# Patient Record
Sex: Female | Born: 1955 | Race: Black or African American | Hispanic: No | Smoking: Never smoker
Health system: Southern US, Community
[De-identification: ages and names within clinical notes are randomized; demographics above are authoritative.]

## PROBLEM LIST (undated history)

## (undated) DIAGNOSIS — I10 Essential (primary) hypertension: Secondary | ICD-10-CM

## (undated) HISTORY — PX: KNEE SURGERY: SHX244

## (undated) HISTORY — PX: BREAST REDUCTION SURGERY: SHX8

## (undated) HISTORY — PX: ABDOMINAL HYSTERECTOMY: SHX81

---

## 2001-11-10 ENCOUNTER — Encounter: Payer: Self-pay | Admitting: Obstetrics & Gynecology

## 2001-11-10 ENCOUNTER — Ambulatory Visit (HOSPITAL_COMMUNITY): Admission: RE | Admit: 2001-11-10 | Discharge: 2001-11-10 | Payer: Self-pay | Admitting: Obstetrics & Gynecology

## 2001-12-16 ENCOUNTER — Encounter: Payer: Self-pay | Admitting: Obstetrics & Gynecology

## 2001-12-16 ENCOUNTER — Ambulatory Visit (HOSPITAL_COMMUNITY): Admission: RE | Admit: 2001-12-16 | Discharge: 2001-12-16 | Payer: Self-pay | Admitting: Obstetrics & Gynecology

## 2002-02-10 ENCOUNTER — Encounter (INDEPENDENT_AMBULATORY_CARE_PROVIDER_SITE_OTHER): Payer: Self-pay

## 2002-02-11 ENCOUNTER — Inpatient Hospital Stay (HOSPITAL_COMMUNITY): Admission: AD | Admit: 2002-02-11 | Discharge: 2002-02-12 | Payer: Self-pay | Admitting: Obstetrics & Gynecology

## 2002-04-29 ENCOUNTER — Emergency Department (HOSPITAL_COMMUNITY): Admission: EM | Admit: 2002-04-29 | Discharge: 2002-04-29 | Payer: Self-pay | Admitting: Emergency Medicine

## 2002-04-29 ENCOUNTER — Encounter: Payer: Self-pay | Admitting: Emergency Medicine

## 2003-08-04 ENCOUNTER — Ambulatory Visit (HOSPITAL_COMMUNITY): Admission: RE | Admit: 2003-08-04 | Discharge: 2003-08-04 | Payer: Self-pay | Admitting: Obstetrics & Gynecology

## 2004-08-23 ENCOUNTER — Ambulatory Visit (HOSPITAL_COMMUNITY): Admission: RE | Admit: 2004-08-23 | Discharge: 2004-08-23 | Payer: Self-pay | Admitting: Obstetrics & Gynecology

## 2006-06-19 ENCOUNTER — Emergency Department (HOSPITAL_COMMUNITY): Admission: EM | Admit: 2006-06-19 | Discharge: 2006-06-19 | Payer: Self-pay | Admitting: Emergency Medicine

## 2010-02-03 ENCOUNTER — Encounter: Payer: Self-pay | Admitting: Obstetrics & Gynecology

## 2010-05-31 NOTE — Op Note (Signed)
Brenda Pierce, Brenda Pierce                        ACCOUNT NO.:  [id number]   MEDICAL RECORD NO.:  [id number]                   PATIENT TYPE:  OBV   LOCATION:  9325                                 FACILITY:  [facility]   PHYSICIAN:  Roseanna Rainbow, M.D.         DATE OF BIRTH:  1955-04-30   DATE OF PROCEDURE:  02/10/2002  DATE OF DISCHARGE:                                 OPERATIVE REPORT   PREOPERATIVE DIAGNOSES:  Small myomatous uterus with secondary abnormal  uterine bleeding.   POSTOPERATIVE DIAGNOSES:  Small myomatous uterus with secondary abnormal  uterine bleeding.   PROCEDURE:  Total vaginal hysterectomy.   SURGEON:  Roseanna Rainbow, M.D. and Bing Neighbors. Clearance Coots, M.D.   ANESTHESIA:  General endotracheal.   COMPLICATIONS:  None.   ESTIMATED BLOOD LOSS:  150 cc   FLUIDS:  As per anesthesiology.   FINDINGS:  Examination under anesthesia small anteverted uterus. Operative  findings, small regularly shaped uterus, normal tubes and ovaries.   DESCRIPTION OF PROCEDURE:  The risks, benefits, indications and alternatives  of the procedure were reviewed with the patient and informed consent was  obtained. She was taken to the operating room with an IV running. She was  placed in the dorsal lithotomy position, prepped and draped in the usual  sterile fashion. A weighted speculum was placed into the vagina and the  cervix grasped with two tenaculum. The cervix was then injected anteriorly  with 1% lidocaine with 1:200,000 epinephrine. The cervix was then incised  anteriorly and the bladder was dissected off the pubovesical cervical fascia  anteriorly with Metzenbaum scissors. The anterior cul-de-sac was entered  sharply. The same procedure was performed posteriorly and the posterior cul-  de-sac entered sharply without difficulty. At this point, parametrial clamps  were placed over the uterosacral ligaments on either side. These were then  transected and suture ligated with  #0 Vicryl. Hemostasis was assured. The  cardinal ligaments were then clamped on both sides, transected and then  suture ligatures placed in a similar fashion. The uterine arteries and broad  ligament were then serially clamped with parametrial clamps, transected and  suture ligated on both sides. Excellent hemostasis was visualized. Both the  cornua were then clamped with parametrial clamps, transected and the uterus  delivered. These pedicles were then secured with free ligatures and suture  ligatures with excellent hemostasis noted. The vaginal cuff angles were  closed with figure-of-eight sutures of #0 Vicryl. The uterosacral ligament  were plicated in the midline. The remainder of the vaginal cuff was closed  with figure-of-eight stitches of #0 Vicryl in an interrupted fashion. All  instruments were then removed from the vagina, the Foley catheter was then  placed and approximately 200 cc of  clear urine was noted. The patient was taken out of dorsal lithotomy  position and awakened from general anesthesia. She was then taken to the  PACU in stable condition. Sponge, lap, needle and instrument  counts were  correct x2.  Pathology, uterus and cervix.                                               Roseanna Rainbow, M.D.    Judee Clara  D:  02/10/2002  T:  02/10/2002  Job:  [id number]

## 2010-05-31 NOTE — H&P (Signed)
NAME:  Brenda Pierce, WACHTER                        ACCOUNT NO.:  [id number]   MEDICAL RECORD NO.:  [id number]                   PATIENT TYPE:   LOCATION:                                       FACILITY:  [facility]   PHYSICIAN:  Roseanna Rainbow, M.D.         DATE OF BIRTH:  07/12/1955   DATE OF ADMISSION:  DATE OF DISCHARGE:                                HISTORY & PHYSICAL   CHIEF COMPLAINT:  The patient is a 55 year old with dysfunctional uterine  bleeding refractory to 10-step medical management who presents for total  vaginal hysterectomy.   HISTORY OF PRESENT ILLNESS:  The patient has a two year history of abnormal  uterine bleeding.  Periodically the bleeding is excessive with secondary  iron deficiency anemia.  Previous work-up includes multiple ultrasounds.  Ultrasound two years ago demonstrated small uterine fibroids.  Recent  ultrasound was initially felt to demonstrate a small submucous myoma.  However, sonohysterogram failed to demonstrate any submucous myoma or polyp.  She has also had a D&C and endometrial biopsy in the past.  She has been  told that her endometrial lining was thickened.  Again, the bleeding has  been refractory to a 10-step medical management with oral contraceptives.   PAST OB/GYN HISTORY:  She is sexually active with 10 lifetime sexual  partners, heterosexual.  Menstrual history, again, menstrual periods are  described as irregular.  The flow is considered normal and no history of  dysmenorrhea.  A recent Pap smear was within normal limits.  Recent  mammogram was within normal limits as well.  The patient has a history of  breast lumps and a possible history of ductal hyperplasia.  Past gynecologic  history is as above and she has a history of a bilateral tubal ligation.  The patient has been pregnant four times.  She has three living children.  Has a history of one miscarriage or abortion.   PAST MEDICAL HISTORY:  1. Type 2 diabetes.  2.  Hypertension.   PAST SURGICAL HISTORY:  As above.  She is status post a breast reduction and  benign breast biopsy.   SOCIAL HISTORY:  She is divorced.  She is a Public relations account executive.  She denies  any significant smoking history.  She drinks minimal amount of alcohol.  She  reports a moderate amount of caffeinated beverages daily.  She denies  illicit drug use.   FAMILY HISTORY:  Positive for cancer of the pancreas, cancer of the stomach,  hypertension, myocardial infarction, CVA, and adult-onset diabetes.   REVIEW OF SYSTEMS:  GENERAL:  Decreased energy level, tires easily.  GENITOURINARY:  See history of present illness.   PHYSICAL EXAMINATION:  VITAL SIGNS:  Height 5 feet 5 inches, weight 196  pounds, temperature 100.4, pulse 84, blood pressure 140/80, respirations 16.  GENERAL/CONSTITUTIONAL:  African-American female.  Appears stated age.  Mildly overweight body habitus.  In no acute distress.  NECK:  Supple.  ABDOMEN:  Soft, nontender, without masses.  Bowel sounds active.  PELVIC:  The external female genitalia are normal appearing.  On bimanual  examination the uterus is nontender, of normal size, shape, and consistency.  The adnexa no masses, organomegaly, or local guarding.   IMPRESSION:  Dysfunctional uterine bleeding refractory to medical  management.   PLAN:  Procedure is total vaginal hysterectomy.  The risks, benefits, and  alternative forms of management were reviewed with the patient and informed  consent was obtained.                                               Roseanna Rainbow, M.D.    Judee Clara  D:  03/14/2002  T:  03/14/2002  Job:  [id number]

## 2010-05-31 NOTE — H&P (Signed)
   NAMEGEORGIAN, MCCLORY                        ACCOUNT NO.:  [id number]   MEDICAL RECORD NO.:  [id number]                   PATIENT TYPE:   LOCATION:                                       [facility]ACILITY:  [facility]   PHYSICIAN:  Roseanna Rainbow, M.D.         DATE OF BIRTH:  [birth year]   DATE OF ADMISSION:  DATE OF DISCHARGE:                                HISTORY & PHYSICAL   ADDENDUM:   MEDICATIONS:  1. Diovan.  2. Metformin.  3. Avandia.  4. Iron supplement.   ALLERGIES:  No known drug allergies.                                               Roseanna Rainbow, M.D.    Judee Clara  D:  03/14/2002  T:  03/14/2002  Job:  [id number]

## 2010-05-31 NOTE — Discharge Summary (Signed)
   Brenda Pierce, Brenda Pierce                        ACCOUNT NO.:  [id number]   MEDICAL RECORD NO.:  [id number]                   PATIENT TYPE:  INP   LOCATION:  9325                                 [facility]ACILITY:  [facility]   PHYSICIAN:  Roseanna Rainbow, M.D.         DATE OF BIRTH:  12/04/1955   DATE OF ADMISSION:  02/10/2002  DATE OF DISCHARGE:  02/12/2002                                 DISCHARGE SUMMARY   HISTORY OF PRESENT ILLNESS:  The patient is a 55 year old with dysfunctional  uterine bleeding who presents for a total vaginal hysterectomy.  Please see  the dictated History and Physical for further details.   HOSPITAL COURSE:  The patient was admitted and underwent a total vaginal  hysterectomy.  Please see the dictated operative summary for further  details.  The patient's postoperative course was uneventful and is  discharged to home on postop day #2, tolerating a regular diet.   DISCHARGE DIAGNOSES:  1. Uterine fibroids.  2. Adenomyosis.   PROCEDURE:  Total vaginal hysterectomy.   CONDITION ON DISCHARGE:  Good.   DIET:  ADA diet.   DISCHARGE MEDICATIONS:  Tylox.   FOLLOW UP:  The patient will follow up in the office in six weeks.                                               Roseanna Rainbow, M.D.    Judee Clara  D:  03/14/2002  T:  03/15/2002  Job:  [id number]

## 2010-09-22 ENCOUNTER — Emergency Department (HOSPITAL_COMMUNITY)
Admission: EM | Admit: 2010-09-22 | Discharge: 2010-09-22 | Disposition: A | Discharge status: Home / Self Care | Payer: BC Managed Care – PPO | Attending: Emergency Medicine | Admitting: Emergency Medicine

## 2010-09-22 ENCOUNTER — Encounter: Payer: Self-pay | Admitting: *Deleted

## 2010-09-22 ENCOUNTER — Other Ambulatory Visit: Payer: Self-pay

## 2010-09-22 DIAGNOSIS — T783XXA Angioneurotic edema, initial encounter: Secondary | ICD-10-CM | POA: Insufficient documentation

## 2010-09-22 DIAGNOSIS — Y92009 Unspecified place in unspecified non-institutional (private) residence as the place of occurrence of the external cause: Secondary | ICD-10-CM | POA: Insufficient documentation

## 2010-09-22 DIAGNOSIS — T502X5A Adverse effect of carbonic-anhydrase inhibitors, benzothiadiazides and other diuretics, initial encounter: Secondary | ICD-10-CM | POA: Insufficient documentation

## 2010-09-22 DIAGNOSIS — Z79899 Other long term (current) drug therapy: Secondary | ICD-10-CM | POA: Insufficient documentation

## 2010-09-22 DIAGNOSIS — I10 Essential (primary) hypertension: Secondary | ICD-10-CM | POA: Insufficient documentation

## 2010-09-22 HISTORY — DX: Essential (primary) hypertension: I10

## 2010-09-22 MED ORDER — METHYLPREDNISOLONE SODIUM SUCC 125 MG IJ SOLR
125.0000 mg | Freq: Once | INTRAMUSCULAR | Status: AC
  Administered 2010-09-22: 125 mg via INTRAVENOUS
  Filled 2010-09-22: qty 2

## 2010-09-22 MED ORDER — FAMOTIDINE IN NACL 20-0.9 MG/50ML-% IV SOLN
20.0000 mg | Freq: Once | INTRAVENOUS | Status: AC
  Administered 2010-09-22: 20 mg via INTRAVENOUS
  Filled 2010-09-22: qty 50

## 2010-09-22 MED ORDER — PREDNISONE 10 MG PO TABS: 20.0000 mg | ORAL_TABLET | Freq: Every day | ORAL | Status: AC

## 2010-09-22 MED ORDER — SODIUM CHLORIDE 0.9 % IV SOLN
Freq: Once | INTRAVENOUS | Status: AC
  Administered 2010-09-22: 1000 mL via INTRAVENOUS

## 2010-09-22 MED ORDER — DIPHENHYDRAMINE HCL 50 MG/ML IJ SOLN
25.0000 mg | Freq: Once | INTRAMUSCULAR | Status: AC
  Administered 2010-09-22: 25 mg via INTRAVENOUS
  Filled 2010-09-22: qty 1

## 2010-09-22 MED ORDER — DIPHENHYDRAMINE HCL 25 MG PO CAPS: 25.0000 mg | ORAL_CAPSULE | Freq: Four times a day (QID) | ORAL | Status: AC | PRN

## 2010-09-22 MED ORDER — EPINEPHRINE 0.3 MG/0.3ML IJ DEVI: 0.3000 mg | INTRAMUSCULAR | Status: DC | PRN

## 2010-09-22 NOTE — ED Notes (Signed)
Pt self ambulated out with a steady gait stating no needs. Pt to follow up with pmd to get on to alternative medications and will return if condition worsens.

## 2010-09-22 NOTE — ED Provider Notes (Signed)
History     CSN: [id number] Arrival date & time: 09/22/2010  5:54 AM  Chief Complaint  Patient presents with  . Allergic Reaction   Patient is a 55 y.o. female presenting with allergic reaction. The history is provided by the patient and the spouse.  Allergic Reaction The primary symptoms are  angioedema. The primary symptoms do not include wheezing, shortness of breath, cough, abdominal pain, palpitations or rash. The current episode started 1 to 2 hours ago. The problem has not changed since onset. The angioedema is not associated with shortness of breath or stridor.  Associated with: started a new RX last month Tribenzor (olmesartan/amlodipine/ HCTZ) Significant symptoms also include itching.  she did c/o some chest tightness, no SOB, she woke up with symptoms around 5am - tongue swelling that remains unchanged, does not feel like she has any swelling in her throat. She states she had a similar reaction to diovan (valsartan) in the past.  Past Medical History  Diagnosis Date  . Hypertension   . Diabetes mellitus     Past Surgical History  Procedure Date  . Breast reduction surgery   . Knee surgery     History reviewed. No pertinent family history.  History  Substance Use Topics  . Smoking status: Never Smoker   . Smokeless tobacco: Not on file  . Alcohol Use: Yes    OB History    Grav Para Term Preterm Abortions TAB SAB Ect Mult Living                  Review of Systems  Constitutional: Negative for fever and chills.  HENT: Negative for sore throat, drooling, trouble swallowing, neck pain and neck stiffness.   Eyes: Negative for pain.  Respiratory: Negative for cough, choking, shortness of breath, wheezing and stridor.   Cardiovascular: Negative for palpitations and leg swelling.  Gastrointestinal: Negative for abdominal pain.  Genitourinary: Negative for dysuria.  Musculoskeletal: Negative for back pain.  Skin: Positive for itching. Negative for rash.    Neurological: Negative for headaches.  All other systems reviewed and are negative.    Physical Exam  BP 131/73  Pulse 85  Temp(Src) 98.1 F (36.7 C) (Oral)  Resp 18  Ht 5\' 4"  (1.626 m)  Wt 205 lb (92.987 kg)  BMI 35.19 kg/m2  SpO2 93%  Physical Exam  Constitutional: She is oriented to person, place, and time. She appears well-developed and well-nourished.  HENT:  Head: Normocephalic and atraumatic.  Mouth/Throat: No oropharyngeal exudate.       Tongue is enlarged, uvula midline without uvular edema. No submental fullness or tenderness to palpation. No stridor.  Eyes: Conjunctivae and EOM are normal. Pupils are equal, round, and reactive to light.  Neck: Trachea normal. Neck supple. No thyromegaly present.  Cardiovascular: Normal rate, regular rhythm, S1 normal, S2 normal and normal pulses.     No systolic murmur is present   No diastolic murmur is present  Pulses:      Radial pulses are 2+ on the right side, and 2+ on the left side.  Pulmonary/Chest: Effort normal and breath sounds normal. She has no wheezes. She has no rhonchi.  Abdominal: Soft. Normal appearance and bowel sounds are normal. There is no tenderness. There is no CVA tenderness and negative Murphy's sign.  Neurological: She is alert and oriented to person, place, and time. She has normal strength. No cranial nerve deficit or sensory deficit. GCS eye subscore is 4. GCS verbal subscore is 5. GCS motor  subscore is 6.  Skin: Skin is warm and dry. No rash noted. She is not diaphoretic.  Psychiatric: Her speech is normal.       Cooperative and appropriate    ED Course  Procedures   Date: 09/22/2010  Rate: 76  Rhythm: normal sinus rhythm  QRS Axis: normal  Intervals: normal  ST/T Wave abnormalities: nonspecific ST/T changes  Conduction Disutrbances:none  Narrative Interpretation:   Old EKG Reviewed: none available  MDM  Angioedema recently started on ARB as above. PT had similar reaction to ARB in the  past and at this point should NOT be on any ARB or ACE-I given her angioedema. She was given IV solumedrol, benadryl and pepcid and observed closely in the ED for any progressive or worsening symptoms. She is protecting her airway well on initial evaluation. ECG done in triage for complaint of chest tightness- no wheezing and has good air movement on exam. On recheck feeling improved and is stable for d/c home with close PCP f/u and strict return precautions verbalized as understood.       Sunnie Nielsen, MD 09/23/10 [phone]

## 2010-09-22 NOTE — ED Notes (Addendum)
Pt states throat fells like swelling. States feels like a tightness in her chest.  Started 1 hours ago. Started new med a month ago.

## 2010-09-22 NOTE — ED Notes (Signed)
Pt reports throat feels like it is swelling & a tightness in her chest. Pt whispering when answering questions. No new meds or foods noted. Started as itching on her legs.

## 2010-10-31 LAB — URINE MICROSCOPIC-ADD ON

## 2010-10-31 LAB — CBC
MCHC: 34.5
RBC: 4.59
RDW: 13.2

## 2010-10-31 LAB — BASIC METABOLIC PANEL
BUN: 6
Calcium: 9.1
Chloride: 103
Creatinine, Ser: 0.57
GFR calc Af Amer: >60
GFR calc non Af Amer: >60
Glucose, Bld: 104 — ABNORMAL HIGH
Potassium: 3.8

## 2010-10-31 LAB — DIFFERENTIAL
Lymphocytes Relative: 29
Lymphs Abs: 1.6
Monocytes Absolute: 0.4

## 2010-10-31 LAB — URINALYSIS, ROUTINE W REFLEX MICROSCOPIC
Bilirubin Urine: NEGATIVE
Glucose, UA: NEGATIVE
Specific Gravity, Urine: 1.02
Urobilinogen, UA: 1
pH: 6

## 2011-12-09 ENCOUNTER — Emergency Department (HOSPITAL_COMMUNITY): Payer: BC Managed Care – PPO

## 2011-12-09 ENCOUNTER — Encounter (HOSPITAL_COMMUNITY): Payer: Self-pay | Admitting: Emergency Medicine

## 2011-12-09 ENCOUNTER — Emergency Department (HOSPITAL_COMMUNITY)
Admission: EM | Admit: 2011-12-09 | Discharge: 2011-12-09 | Disposition: A | Discharge status: Home / Self Care | Payer: BC Managed Care – PPO | Attending: Emergency Medicine | Admitting: Emergency Medicine

## 2011-12-09 DIAGNOSIS — I10 Essential (primary) hypertension: Secondary | ICD-10-CM | POA: Insufficient documentation

## 2011-12-09 DIAGNOSIS — E119 Type 2 diabetes mellitus without complications: Secondary | ICD-10-CM | POA: Insufficient documentation

## 2011-12-09 DIAGNOSIS — Z79899 Other long term (current) drug therapy: Secondary | ICD-10-CM | POA: Insufficient documentation

## 2011-12-09 DIAGNOSIS — K37 Unspecified appendicitis: Secondary | ICD-10-CM | POA: Insufficient documentation

## 2011-12-09 DIAGNOSIS — R109 Unspecified abdominal pain: Secondary | ICD-10-CM | POA: Insufficient documentation

## 2011-12-09 LAB — CBC WITH DIFFERENTIAL/PLATELET
Basophils Absolute: 0 10*3/uL (ref 0.0–0.1)
Basophils Relative: 0 % (ref 0–1)
Eosinophils Relative: 1 % (ref 0–5)
Hemoglobin: 13.7 g/dL (ref 12.0–15.0)
Lymphocytes Relative: 26 % (ref 12–46)
Lymphs Abs: 1.7 10*3/uL (ref 0.7–4.0)
MCH: 27.8 pg (ref 26.0–34.0)
Monocytes Relative: 7 % (ref 3–12)
Neutrophils Relative %: 67 % (ref 43–77)
Platelets: 229 10*3/uL (ref 150–400)
WBC: 6.5 10*3/uL (ref 4.0–10.5)

## 2011-12-09 LAB — COMPREHENSIVE METABOLIC PANEL
Albumin: 3.7 g/dL (ref 3.5–5.2)
BUN: 9 mg/dL (ref 6–23)
CO2: 26 mEq/L (ref 19–32)
Calcium: 9.9 mg/dL (ref 8.4–10.5)
Glucose, Bld: 209 mg/dL — ABNORMAL HIGH (ref 70–99)
Total Bilirubin: 0.5 mg/dL (ref 0.3–1.2)
Total Protein: 7.5 g/dL (ref 6.0–8.3)

## 2011-12-09 LAB — URINALYSIS, ROUTINE W REFLEX MICROSCOPIC
Glucose, UA: NEGATIVE mg/dL
Hgb urine dipstick: NEGATIVE
Ketones, ur: NEGATIVE mg/dL
Leukocytes, UA: NEGATIVE
Nitrite: NEGATIVE
Protein, ur: NEGATIVE mg/dL

## 2011-12-09 MED ORDER — ONDANSETRON HCL 4 MG/2ML IJ SOLN
4.0000 mg | Freq: Once | INTRAMUSCULAR | Status: AC
  Administered 2011-12-09: 4 mg via INTRAVENOUS
  Filled 2011-12-09: qty 2

## 2011-12-09 MED ORDER — HYDROMORPHONE HCL PF 1 MG/ML IJ SOLN
1.0000 mg | Freq: Once | INTRAMUSCULAR | Status: AC
  Administered 2011-12-09: 1 mg via INTRAVENOUS
  Filled 2011-12-09: qty 1

## 2011-12-09 MED ORDER — SODIUM CHLORIDE 0.9 % IV SOLN
Freq: Once | INTRAVENOUS | Status: AC
  Administered 2011-12-09: 11:00:00 via INTRAVENOUS

## 2011-12-09 MED ORDER — OXYCODONE-ACETAMINOPHEN 5-325 MG PO TABS: 1.0000 | ORAL_TABLET | ORAL | Status: DC | PRN

## 2011-12-09 MED ORDER — METRONIDAZOLE 500 MG PO TABS: 500.0000 mg | ORAL_TABLET | Freq: Three times a day (TID) | ORAL | Status: DC

## 2011-12-09 MED ORDER — CIPROFLOXACIN HCL 500 MG PO TABS: 500.0000 mg | ORAL_TABLET | Freq: Two times a day (BID) | ORAL | Status: DC

## 2011-12-09 MED ORDER — IOHEXOL 300 MG/ML  SOLN
100.0000 mL | Freq: Once | INTRAMUSCULAR | Status: AC | PRN
  Administered 2011-12-09: 100 mL via INTRAVENOUS

## 2011-12-09 MED ORDER — METOCLOPRAMIDE HCL 10 MG PO TABS: 10.0000 mg | ORAL_TABLET | Freq: Four times a day (QID) | ORAL | Status: DC | PRN

## 2011-12-09 NOTE — ED Notes (Signed)
Pt complaining of left side pain since Sunday. Pt states pain has been constant and seems to be getting worse.

## 2011-12-09 NOTE — ED Provider Notes (Signed)
History  This chart was scribed for Brenda Booze, MD by Manuela Schwartz, ED scribe. This patient was seen in room APA09/APA09 and the patient's care was started at 0939.   CSN: [id number]  Arrival date & time 12/09/11  4098   First MD Initiated Contact with Patient 12/09/11 [phone]      Chief Complaint  Patient presents with  . Abdominal Pain   The history is provided by the patient. No language interpreter was used.   Brenda Pierce is a 56 y.o. female who presents to the Emergency Department complaining of abdominal pain   Constant severe LUQ sharp abdominal pain for 2 days She states associated constipation. There's been no nausea or vomiting. She states worse with movement or change in positions Nothing makes pain any better. She denies fever or chills. Appetite has been good. She has not taken any medication to help her pain. She denies previous episodes. Past Medical History  Diagnosis Date  . Hypertension   . Diabetes mellitus     Past Surgical History  Procedure Date  . Breast reduction surgery   . Knee surgery     History reviewed. No pertinent family history.  History  Substance Use Topics  . Smoking status: Never Smoker   . Smokeless tobacco: Not on file  . Alcohol Use: Yes    OB History    Grav Para Term Preterm Abortions TAB SAB Ect Mult Living                  Review of Systems  Constitutional: Negative for fever and chills.  Respiratory: Negative for shortness of breath.   Gastrointestinal: Negative for nausea and vomiting.  Neurological: Negative for weakness.  All other systems reviewed and are negative.    Allergies  Diovan  Home Medications   Current Outpatient Rx  Name  Route  Sig  Dispense  Refill  . EPINEPHRINE 0.3 MG/0.3ML IJ DEVI   Intramuscular   Inject 0.3 mLs (0.3 mg total) into the muscle as needed.   1 Device   1   . GLIMEPIRIDE 2 MG PO TABS   Oral   Take 2 mg by mouth daily.           Marland Kitchen METOPROLOL SUCCINATE ER 50 MG  PO TB24   Oral   Take 50 mg by mouth daily.           Marland Kitchen PIOGLITAZONE HCL-METFORMIN HCL 15-850 MG PO TABS   Oral   Take 1 tablet by mouth 2 (two) times daily.           Marland Kitchen SAXAGLIPTIN HCL 2.5 MG PO TABS   Oral   Take 5 mg by mouth daily.           Marland Kitchen SIMVASTATIN 10 MG PO TABS   Oral   Take 10 mg by mouth daily.             BP 144/70  Pulse 95  Temp 98 F (36.7 C) (Oral)  Resp 16  SpO2 100%  Physical Exam  Nursing note and vitals reviewed. Constitutional: She is oriented to person, place, and time. She appears well-developed and well-nourished. No distress.  HENT:  Head: Normocephalic and atraumatic.  Eyes: EOM are normal.  Neck: Neck supple. No tracheal deviation present.  Cardiovascular: Normal rate.   Pulmonary/Chest: Effort normal. No respiratory distress.  Abdominal: Soft. Bowel sounds are normal. She exhibits no distension. There is tenderness (Moderate epigastric and LLQQ tenderness, mild epigastric tenderness).  There is no rebound and no guarding.  Musculoskeletal: Normal range of motion.  Neurological: She is alert and oriented to person, place, and time.  Skin: Skin is warm and dry.  Psychiatric: She has a normal mood and affect. Her behavior is normal.    ED Course  Procedures (including critical care time) DIAGNOSTIC STUDIES: Oxygen Saturation is 100% on room air, normal by my interpretation.    COORDINATION OF CARE: At [phone] Discussed treatment plan with patient which includes CT scan, CBC, complete metabolic panel, urinalysis. Patient agrees.   Results for orders placed during the hospital encounter of 12/09/11  CBC WITH DIFFERENTIAL      Component Value Range   WBC 6.5  4.0 - 10.5 K/uL   RBC 4.92  3.87 - 5.11 MIL/uL   Hemoglobin 13.7  12.0 - 15.0 g/dL   HCT 96.0  45.4 - 09.8 %   MCV 83.5  78.0 - 100.0 fL   MCH 27.8  26.0 - 34.0 pg   MCHC 33.3  30.0 - 36.0 g/dL   RDW 11.9  14.7 - 82.9 %   Platelets 229  150 - 400 K/uL   Neutrophils  Relative 67  43 - 77 %   Neutro Abs 4.4  1.7 - 7.7 K/uL   Lymphocytes Relative 26  12 - 46 %   Lymphs Abs 1.7  0.7 - 4.0 K/uL   Monocytes Relative 7  3 - 12 %   Monocytes Absolute 0.5  0.1 - 1.0 K/uL   Eosinophils Relative 1  0 - 5 %   Eosinophils Absolute 0.0  0.0 - 0.7 K/uL   Basophils Relative 0  0 - 1 %   Basophils Absolute 0.0  0.0 - 0.1 K/uL  COMPREHENSIVE METABOLIC PANEL      Component Value Range   Sodium 136  135 - 145 mEq/L   Potassium 3.6  3.5 - 5.1 mEq/L   Chloride 98  96 - 112 mEq/L   CO2 26  19 - 32 mEq/L   Glucose, Bld 209 (*) 70 - 99 mg/dL   BUN 9  6 - 23 mg/dL   Creatinine, Ser 5.62  0.50 - 1.10 mg/dL   Calcium 9.9  8.4 - 13.0 mg/dL   Total Protein 7.5  6.0 - 8.3 g/dL   Albumin 3.7  3.5 - 5.2 g/dL   AST 14  0 - 37 U/L   ALT 17  0 - 35 U/L   Alkaline Phosphatase 105  39 - 117 U/L   Total Bilirubin 0.5  0.3 - 1.2 mg/dL   GFR calc non Af Amer >90  >90 mL/min   GFR calc Af Amer >90  >90 mL/min  LIPASE, BLOOD      Component Value Range   Lipase 19  11 - 59 U/L  URINALYSIS, ROUTINE W REFLEX MICROSCOPIC      Component Value Range   Color, Urine YELLOW  YELLOW   APPearance CLEAR  CLEAR   Specific Gravity, Urine 1.025  1.005 - 1.030   pH 6.0  5.0 - 8.0   Glucose, UA NEGATIVE  NEGATIVE mg/dL   Hgb urine dipstick NEGATIVE  NEGATIVE   Bilirubin Urine NEGATIVE  NEGATIVE   Ketones, ur NEGATIVE  NEGATIVE mg/dL   Protein, ur NEGATIVE  NEGATIVE mg/dL   Urobilinogen, UA 0.2  0.0 - 1.0 mg/dL   Nitrite NEGATIVE  NEGATIVE   Leukocytes, UA NEGATIVE  NEGATIVE   Ct Abdomen Pelvis W Contrast  12/09/2011  *  RADIOLOGY REPORT*  Clinical Data: Left abdominal pain for 2 days.  History of diabetes and partial hysterectomy.  CT ABDOMEN AND PELVIS WITH CONTRAST  Technique:  Multidetector CT imaging of the abdomen and pelvis was performed following the standard protocol during bolus administration of intravenous contrast.  Contrast: OMNIPAQUE IOHEXOL 300 MG/ML  SOLN  Comparison:  Noncontrast abdominal pelvic CT 06/19/2006.  Findings: The lung bases are clear.  There is no pleural effusion. Previously noted hepatic steatosis has improved.  There are no focal liver lesions.  The gallbladder, biliary system, pancreas and spleen appear normal.  There is no adrenal mass.  Both kidneys appear normal.  There is mild soft tissue stranding in the omental fat within the left false pelvis (axial images 56 - 63).  This is inferior to the transverse colon which demonstrates no wall thickening.  There is mild sigmoid colon diverticulosis, but no definite active diverticulitis.  There is no extraluminal fluid collection or evidence of bowel obstruction. The previously noted inflammatory changes inferior to the liver have resolved.  There is no adnexal mass status post partial hysterectomy.  The ovaries appear unremarkable.  There is a probable small bladder diverticulum on the right, unchanged.  The bladder otherwise appears normal.  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  IMPRESSION:  1.  Recurrent inflammatory process, now involving the omental fat on the left.  The appearance is most consistent with appendagitis epiploica.  No evidence of diverticulitis, perforation or abscess. 2.  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 3.  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   Original Report Authenticated By: Carey Bullocks, M.D.       1. Abdominal pain   2. Appendicitis epiploica       MDM  Abdominal pain in pattern suspicious for diverticulitis. CT scan has been ordered. Patient has been offered IV narcotics, but advised she would need to have someone drive her home. She has opted to wait until testing is completed before getting narcotics.  CT scan does show diverticulosis but no evidence of diverticulitis and there is some suggestion of appendicitis epiploica. She will be  sent home with prescriptions for ciprofloxacin and metronidazole and also given prescriptions for Percocet for pain and metoclopramide for nausea. She is to followup with her PCP in one week.   I personally performed the services described in this documentation, which was scribed in my presence. The recorded information has been reviewed and is accurate.           Brenda Booze, MD 12/09/11 [phone]

## 2012-01-29 ENCOUNTER — Other Ambulatory Visit (HOSPITAL_COMMUNITY): Payer: Self-pay | Admitting: Internal Medicine

## 2012-01-29 DIAGNOSIS — M549 Dorsalgia, unspecified: Secondary | ICD-10-CM

## 2012-01-30 ENCOUNTER — Ambulatory Visit (HOSPITAL_COMMUNITY): Payer: BC Managed Care – PPO

## 2012-02-04 ENCOUNTER — Ambulatory Visit (HOSPITAL_COMMUNITY)
Admission: RE | Admit: 2012-02-04 | Discharge: 2012-02-04 | Disposition: A | Discharge status: Home / Self Care | Payer: BC Managed Care – PPO | Source: Ambulatory Visit | Attending: Internal Medicine | Admitting: Internal Medicine

## 2012-02-04 DIAGNOSIS — M549 Dorsalgia, unspecified: Secondary | ICD-10-CM

## 2012-02-04 DIAGNOSIS — R209 Unspecified disturbances of skin sensation: Secondary | ICD-10-CM | POA: Insufficient documentation

## 2012-02-04 DIAGNOSIS — M5126 Other intervertebral disc displacement, lumbar region: Secondary | ICD-10-CM | POA: Insufficient documentation

## 2012-02-04 DIAGNOSIS — M546 Pain in thoracic spine: Secondary | ICD-10-CM | POA: Insufficient documentation

## 2012-02-04 DIAGNOSIS — M5124 Other intervertebral disc displacement, thoracic region: Secondary | ICD-10-CM | POA: Insufficient documentation

## 2012-02-04 DIAGNOSIS — M545 Low back pain, unspecified: Secondary | ICD-10-CM | POA: Insufficient documentation

## 2012-02-04 DIAGNOSIS — M79609 Pain in unspecified limb: Secondary | ICD-10-CM | POA: Insufficient documentation

## 2013-06-29 ENCOUNTER — Encounter (HOSPITAL_COMMUNITY): Payer: Self-pay | Admitting: Pharmacy Technician

## 2013-07-11 ENCOUNTER — Encounter (HOSPITAL_COMMUNITY): Payer: Self-pay | Admitting: *Deleted

## 2013-07-11 ENCOUNTER — Encounter (HOSPITAL_COMMUNITY)
Admission: RE | Disposition: A | Discharge status: Home / Self Care | Payer: Self-pay | Source: Ambulatory Visit | Attending: Ophthalmology

## 2013-07-11 ENCOUNTER — Ambulatory Visit (HOSPITAL_COMMUNITY)
Admission: RE | Admit: 2013-07-11 | Discharge: 2013-07-11 | Disposition: A | Discharge status: Home / Self Care | Payer: BC Managed Care – PPO | Source: Ambulatory Visit | Attending: Ophthalmology | Admitting: Ophthalmology

## 2013-07-11 DIAGNOSIS — E119 Type 2 diabetes mellitus without complications: Secondary | ICD-10-CM | POA: Insufficient documentation

## 2013-07-11 DIAGNOSIS — H4089 Other specified glaucoma: Secondary | ICD-10-CM | POA: Insufficient documentation

## 2013-07-11 HISTORY — PX: SLT LASER APPLICATION: SHX6099

## 2013-07-11 SURGERY — SLT LASER APPLICATION
Anesthesia: LOCAL | Laterality: Left

## 2013-07-11 MED ORDER — TETRACAINE HCL 0.5 % OP SOLN
1.0000 [drp] | Freq: Once | OPHTHALMIC | Status: AC
  Administered 2013-07-11: 1 [drp] via OPHTHALMIC

## 2013-07-11 MED ORDER — PILOCARPINE HCL 1 % OP SOLN
OPHTHALMIC | Status: AC
  Filled 2013-07-11: qty 15

## 2013-07-11 MED ORDER — PILOCARPINE HCL 1 % OP SOLN
2.0000 [drp] | Freq: Once | OPHTHALMIC | Status: AC
  Administered 2013-07-11: 2 [drp] via OPHTHALMIC

## 2013-07-11 MED ORDER — APRACLONIDINE HCL 1 % OP SOLN
OPHTHALMIC | Status: AC
  Filled 2013-07-11: qty 0.1

## 2013-07-11 MED ORDER — APRACLONIDINE HCL 1 % OP SOLN
1.0000 [drp] | OPHTHALMIC | Status: AC
  Administered 2013-07-11 (×3): 1 [drp] via OPHTHALMIC

## 2013-07-11 MED ORDER — TETRACAINE HCL 0.5 % OP SOLN
OPHTHALMIC | Status: AC
  Filled 2013-07-11: qty 2

## 2013-07-11 NOTE — Brief Op Note (Signed)
Brenda Pierce 07/11/2013  Williams Che, MD  Yag Laser Self Test CompletedYes.  . Procedure: SLT, left eye.  Eye Protection Worn by Staff Yes.  . Laser In Use Sign on Door Yes.  .  Laser: Nd:YAG Spot Size: Fixed Burst Mode: III Power Setting: 0.8 mJ/burst Position treated: 360 degrees trabecular meshwork Number of shots: 97 Total energy delivered: 78.6 mJ The patient tolerated the procedure without difficulty. No complications were encountered.  Tenometer reading immediately after procedure:  16 mmHg. The patient was discharged home with the instructions to continue all her current glaucoma medications, if any.  Patient verbalizes understanding of discharge instructions Yes.  .   Notes:gonioscopy OS:  plateau configuration superiorly,toherwise wide open with little pigment, no synechiae or recessions

## 2013-07-11 NOTE — Op Note (Signed)
None required-procedure 

## 2013-07-11 NOTE — H&P (Signed)
I have reviewed the pre printed H&P, the patient was re-examined, and I have identified no significant interval changes in the patient's medical condition.  There is no change in the plan of care since the history and physical of record. 

## 2013-07-11 NOTE — Discharge Instructions (Signed)
Destany Severns [place]  07/11/2013     Instructions    Activity: No Restrictions.   Diet: Resume Diet you were on at home.   Pain Medication: Tylenol if Needed.   CONTACT YOUR DOCTOR IF YOU HAVE PAIN, REDNESS IN YOUR EYE, OR DECREASED VISION.   Follow-up: July 21st at 1:30 with Williams Che, MD.    Dr. Iona Hansen: [phone]    If you find that you cannot contact your physician, but feel that your signs and   Symptoms warrant a physician's attention, call the Emergency Room at   [phone].

## 2016-02-12 ENCOUNTER — Other Ambulatory Visit (HOSPITAL_COMMUNITY): Payer: Self-pay | Admitting: Internal Medicine

## 2016-02-12 DIAGNOSIS — Z1231 Encounter for screening mammogram for malignant neoplasm of breast: Secondary | ICD-10-CM

## 2016-02-12 DIAGNOSIS — E559 Vitamin D deficiency, unspecified: Secondary | ICD-10-CM

## 2016-02-12 DIAGNOSIS — Z780 Asymptomatic menopausal state: Secondary | ICD-10-CM

## 2016-02-13 ENCOUNTER — Other Ambulatory Visit (HOSPITAL_COMMUNITY): Payer: BC Managed Care – PPO

## 2016-02-14 ENCOUNTER — Ambulatory Visit (HOSPITAL_COMMUNITY)
Admission: RE | Admit: 2016-02-14 | Discharge: 2016-02-14 | Disposition: A | Discharge status: Home / Self Care | Payer: BC Managed Care – PPO | Source: Ambulatory Visit | Attending: Internal Medicine | Admitting: Internal Medicine

## 2016-02-14 DIAGNOSIS — Z13820 Encounter for screening for osteoporosis: Secondary | ICD-10-CM | POA: Diagnosis not present

## 2016-02-14 DIAGNOSIS — E559 Vitamin D deficiency, unspecified: Secondary | ICD-10-CM | POA: Diagnosis not present

## 2016-02-14 DIAGNOSIS — Z1231 Encounter for screening mammogram for malignant neoplasm of breast: Secondary | ICD-10-CM | POA: Diagnosis not present

## 2016-02-14 DIAGNOSIS — Z780 Asymptomatic menopausal state: Secondary | ICD-10-CM | POA: Diagnosis not present

## 2016-10-01 ENCOUNTER — Telehealth: Payer: Self-pay

## 2016-10-01 NOTE — Telephone Encounter (Signed)
[phone] (work) or  [phone]  Patient received letter to schedule tcs  No current gi issues and stated she would possibly want to wait until after the new year.  Told her that you would speak to her about that

## 2016-10-09 NOTE — Telephone Encounter (Signed)
LMOM for pt that if she is not having any problems and wants to wait until the first of the year, that is fine. Just call back and let me know definitely which she decides to do.

## 2018-02-04 ENCOUNTER — Other Ambulatory Visit: Payer: Self-pay

## 2018-02-04 ENCOUNTER — Encounter (HOSPITAL_COMMUNITY): Payer: Self-pay | Admitting: Emergency Medicine

## 2018-02-04 ENCOUNTER — Observation Stay (HOSPITAL_COMMUNITY)
Admission: EM | Admit: 2018-02-04 | Discharge: 2018-02-05 | Disposition: A | Discharge status: Home / Self Care | Payer: BC Managed Care – PPO | Attending: Family Medicine | Admitting: Family Medicine

## 2018-02-04 DIAGNOSIS — R2689 Other abnormalities of gait and mobility: Secondary | ICD-10-CM | POA: Insufficient documentation

## 2018-02-04 DIAGNOSIS — I10 Essential (primary) hypertension: Secondary | ICD-10-CM | POA: Diagnosis not present

## 2018-02-04 DIAGNOSIS — T783XXA Angioneurotic edema, initial encounter: Principal | ICD-10-CM | POA: Diagnosis present

## 2018-02-04 DIAGNOSIS — Z7984 Long term (current) use of oral hypoglycemic drugs: Secondary | ICD-10-CM | POA: Insufficient documentation

## 2018-02-04 DIAGNOSIS — M6281 Muscle weakness (generalized): Secondary | ICD-10-CM | POA: Insufficient documentation

## 2018-02-04 DIAGNOSIS — E1165 Type 2 diabetes mellitus with hyperglycemia: Secondary | ICD-10-CM | POA: Insufficient documentation

## 2018-02-04 DIAGNOSIS — E876 Hypokalemia: Secondary | ICD-10-CM | POA: Diagnosis present

## 2018-02-04 DIAGNOSIS — X58XXXA Exposure to other specified factors, initial encounter: Secondary | ICD-10-CM | POA: Diagnosis not present

## 2018-02-04 DIAGNOSIS — R2681 Unsteadiness on feet: Secondary | ICD-10-CM | POA: Insufficient documentation

## 2018-02-04 DIAGNOSIS — Z888 Allergy status to other drugs, medicaments and biological substances status: Secondary | ICD-10-CM | POA: Diagnosis not present

## 2018-02-04 DIAGNOSIS — T7840XA Allergy, unspecified, initial encounter: Secondary | ICD-10-CM | POA: Diagnosis present

## 2018-02-04 DIAGNOSIS — Z79899 Other long term (current) drug therapy: Secondary | ICD-10-CM | POA: Diagnosis not present

## 2018-02-04 DIAGNOSIS — Z794 Long term (current) use of insulin: Secondary | ICD-10-CM

## 2018-02-04 LAB — GLUCOSE, CAPILLARY
Glucose-Capillary: 313 mg/dL — ABNORMAL HIGH (ref 70–99)
Glucose-Capillary: 283 mg/dL — ABNORMAL HIGH (ref 70–99)

## 2018-02-04 LAB — BASIC METABOLIC PANEL
Anion gap: 12 (ref 5–15)
BUN: 16 mg/dL (ref 8–23)
CHLORIDE: 105 mmol/L (ref 98–111)
CO2: 22 mmol/L (ref 22–32)
Calcium: 9.1 mg/dL (ref 8.9–10.3)
Creatinine, Ser: 0.9 mg/dL (ref 0.44–1.00)
GFR calc Af Amer: >60 mL/min (ref >60)
GFR calc non Af Amer: >60 mL/min (ref >60)
Glucose, Bld: 223 mg/dL — ABNORMAL HIGH (ref 70–99)
Potassium: 3.2 mmol/L — ABNORMAL LOW (ref 3.5–5.1)
Sodium: 139 mmol/L (ref 135–145)

## 2018-02-04 LAB — CBC WITH DIFFERENTIAL/PLATELET
Abs Immature Granulocytes: 0.01 10*3/uL (ref 0.00–0.07)
Basophils Absolute: 0 10*3/uL (ref 0.0–0.1)
Basophils Relative: 0 %
Eosinophils Absolute: 0.1 10*3/uL (ref 0.0–0.5)
Eosinophils Relative: 1 %
HCT: 44.4 % (ref 36.0–46.0)
HEMOGLOBIN: 14 g/dL (ref 12.0–15.0)
Immature Granulocytes: 0 %
Lymphocytes Relative: 49 %
Lymphs Abs: 3.6 10*3/uL (ref 0.7–4.0)
MCH: 27.4 pg (ref 26.0–34.0)
MCHC: 31.5 g/dL (ref 30.0–36.0)
MCV: 86.9 fL (ref 80.0–100.0)
MONOS PCT: 9 %
Monocytes Absolute: 0.7 10*3/uL (ref 0.1–1.0)
Neutro Abs: 3 10*3/uL (ref 1.7–7.7)
Neutrophils Relative %: 41 %
Platelets: 248 10*3/uL (ref 150–400)
RBC: 5.11 MIL/uL (ref 3.87–5.11)
RDW: 13.2 % (ref 11.5–15.5)
WBC: 7.4 10*3/uL (ref 4.0–10.5)
nRBC: 0 % (ref 0.0–0.2)

## 2018-02-04 LAB — TYPE AND SCREEN
ABO/RH(D): O POS
Antibody Screen: NEGATIVE

## 2018-02-04 LAB — MAGNESIUM: Magnesium: 2.1 mg/dL (ref 1.7–2.4)

## 2018-02-04 LAB — HEMOGLOBIN A1C
Hgb A1c MFr Bld: 8.6 % — ABNORMAL HIGH (ref 4.8–5.6)
Mean Plasma Glucose: 200.12 mg/dL

## 2018-02-04 MED ORDER — TRANEXAMIC ACID-NACL 1000-0.7 MG/100ML-% IV SOLN
1000.0000 mg | Freq: Once | INTRAVENOUS | Status: AC
  Administered 2018-02-04: 1000 mg via INTRAVENOUS
  Filled 2018-02-04: qty 100

## 2018-02-04 MED ORDER — SODIUM CHLORIDE 0.9 % IV SOLN
10.0000 mL/h | Freq: Once | INTRAVENOUS | Status: AC
  Administered 2018-02-04: 10 mL/h via INTRAVENOUS

## 2018-02-04 MED ORDER — ACETAMINOPHEN 325 MG PO TABS
650.0000 mg | ORAL_TABLET | Freq: Four times a day (QID) | ORAL | Status: DC | PRN
  Administered 2018-02-05: 650 mg via ORAL
  Filled 2018-02-04: qty 2

## 2018-02-04 MED ORDER — DIPHENHYDRAMINE HCL 50 MG/ML IJ SOLN
25.0000 mg | Freq: Once | INTRAMUSCULAR | Status: AC
  Administered 2018-02-04: 25 mg via INTRAVENOUS
  Filled 2018-02-04: qty 1

## 2018-02-04 MED ORDER — HYDRALAZINE HCL 20 MG/ML IJ SOLN: 10.0000 mg | INTRAMUSCULAR | Status: DC | PRN

## 2018-02-04 MED ORDER — ENOXAPARIN SODIUM 40 MG/0.4ML ~~LOC~~ SOLN
40.0000 mg | SUBCUTANEOUS | Status: DC
  Administered 2018-02-04: 40 mg via SUBCUTANEOUS
  Filled 2018-02-04: qty 0.4

## 2018-02-04 MED ORDER — INSULIN ASPART 100 UNIT/ML ~~LOC~~ SOLN
6.0000 [IU] | Freq: Three times a day (TID) | SUBCUTANEOUS | Status: DC
  Administered 2018-02-04 – 2018-02-05 (×2): 6 [IU] via SUBCUTANEOUS

## 2018-02-04 MED ORDER — METHYLPREDNISOLONE SODIUM SUCC 40 MG IJ SOLR
40.0000 mg | Freq: Four times a day (QID) | INTRAMUSCULAR | Status: AC
  Administered 2018-02-04 – 2018-02-05 (×4): 40 mg via INTRAVENOUS
  Filled 2018-02-04 (×4): qty 1

## 2018-02-04 MED ORDER — ONDANSETRON HCL 4 MG PO TABS: 4.0000 mg | ORAL_TABLET | Freq: Four times a day (QID) | ORAL | Status: DC | PRN

## 2018-02-04 MED ORDER — POTASSIUM CHLORIDE 10 MEQ/100ML IV SOLN: 10.0000 meq | INTRAVENOUS | Status: DC

## 2018-02-04 MED ORDER — FAMOTIDINE 20 MG PO TABS
20.0000 mg | ORAL_TABLET | Freq: Two times a day (BID) | ORAL | Status: DC
  Administered 2018-02-04 – 2018-02-05 (×2): 20 mg via ORAL
  Filled 2018-02-04 (×2): qty 1

## 2018-02-04 MED ORDER — TRAZODONE HCL 50 MG PO TABS: 25.0000 mg | ORAL_TABLET | Freq: Every evening | ORAL | Status: DC | PRN

## 2018-02-04 MED ORDER — POTASSIUM CHLORIDE IN NACL 20-0.9 MEQ/L-% IV SOLN
INTRAVENOUS | Status: DC
  Administered 2018-02-04: 14:00:00 via INTRAVENOUS

## 2018-02-04 MED ORDER — ACETAMINOPHEN 650 MG RE SUPP: 650.0000 mg | Freq: Four times a day (QID) | RECTAL | Status: DC | PRN

## 2018-02-04 MED ORDER — POTASSIUM CHLORIDE 10 MEQ/100ML IV SOLN
10.0000 meq | INTRAVENOUS | Status: AC
  Administered 2018-02-04 (×3): 10 meq via INTRAVENOUS
  Filled 2018-02-04 (×3): qty 100

## 2018-02-04 MED ORDER — POLYETHYLENE GLYCOL 3350 17 G PO PACK: 17.0000 g | PACK | Freq: Every day | ORAL | Status: DC | PRN

## 2018-02-04 MED ORDER — LORATADINE 10 MG PO TABS
10.0000 mg | ORAL_TABLET | Freq: Every day | ORAL | Status: DC
  Administered 2018-02-04 – 2018-02-05 (×2): 10 mg via ORAL
  Filled 2018-02-04 (×2): qty 1

## 2018-02-04 MED ORDER — ONDANSETRON HCL 4 MG/2ML IJ SOLN: 4.0000 mg | Freq: Four times a day (QID) | INTRAMUSCULAR | Status: DC | PRN

## 2018-02-04 MED ORDER — INSULIN ASPART 100 UNIT/ML ~~LOC~~ SOLN
0.0000 [IU] | Freq: Every day | SUBCUTANEOUS | Status: DC
  Administered 2018-02-04: 3 [IU] via SUBCUTANEOUS

## 2018-02-04 MED ORDER — METOPROLOL SUCCINATE ER 50 MG PO TB24
50.0000 mg | ORAL_TABLET | Freq: Every day | ORAL | Status: DC
  Administered 2018-02-04 – 2018-02-05 (×2): 50 mg via ORAL
  Filled 2018-02-04 (×2): qty 1

## 2018-02-04 MED ORDER — METHYLPREDNISOLONE SODIUM SUCC 125 MG IJ SOLR
125.0000 mg | Freq: Once | INTRAMUSCULAR | Status: AC
  Administered 2018-02-04: 125 mg via INTRAVENOUS
  Filled 2018-02-04: qty 2

## 2018-02-04 MED ORDER — EPINEPHRINE 0.3 MG/0.3ML IJ SOAJ
0.3000 mg | Freq: Once | INTRAMUSCULAR | Status: AC
  Administered 2018-02-04: 0.3 mg via INTRAMUSCULAR
  Filled 2018-02-04: qty 0.3

## 2018-02-04 MED ORDER — INSULIN GLARGINE 100 UNIT/ML ~~LOC~~ SOLN
45.0000 [IU] | Freq: Every day | SUBCUTANEOUS | Status: DC
  Administered 2018-02-04 – 2018-02-05 (×2): 45 [IU] via SUBCUTANEOUS
  Filled 2018-02-04 (×5): qty 0.45

## 2018-02-04 MED ORDER — TRANEXAMIC ACID 1000 MG/10ML IV SOLN
1000.0000 mg | Freq: Once | INTRAVENOUS | Status: DC
  Filled 2018-02-04: qty 10

## 2018-02-04 MED ORDER — INSULIN ASPART 100 UNIT/ML ~~LOC~~ SOLN
0.0000 [IU] | Freq: Three times a day (TID) | SUBCUTANEOUS | Status: DC
  Administered 2018-02-04: 15 [IU] via SUBCUTANEOUS
  Administered 2018-02-05: 7 [IU] via SUBCUTANEOUS

## 2018-02-04 MED ORDER — SODIUM CHLORIDE 0.9 % IV BOLUS
500.0000 mL | Freq: Once | INTRAVENOUS | Status: AC
  Administered 2018-02-04: 08:00:00 via INTRAVENOUS

## 2018-02-04 MED ORDER — LATANOPROST 0.005 % OP SOLN
1.0000 [drp] | Freq: Every day | OPHTHALMIC | Status: DC
  Administered 2018-02-04: 1 [drp] via OPHTHALMIC
  Filled 2018-02-04: qty 2.5

## 2018-02-04 NOTE — H&P (Signed)
History and Physical  Byanka Landrus Bastone HGD:[id number] DOB: Jan 04, 1956 DOA: 02/04/2018  Referring physician: Laverta Baltimore  PCP: Celene Squibb, MD   Chief Complaint: tongue swelling  HPI: Brenda Pierce is a 63 y.o. female with history of angioedema presented to ED complaining of tongue swelling and itching all over body that woke her up from sleeping around 5 am.  She says that she felt very similar to when she has had angioedema in the past when she was taking diovan.  This was about 7 years ago.  She is no longer taking any ACE or ARB.  Today, she experienced the sensation of tongue and lip swelling and tingling in both fingers associated with itching over entire body and it woke her up from sleeping.  She has had several mild cases of angioedema in the last 7 years since she discontinued the medications that she thought was causing it. She self medicated and treated at home with benadryl for at least 2 prior episodes and the symptoms eventually resolved. This time she became afraid because the symptoms seemed more severe.  She denies rash, chest pain, SOB, nausea, emesis, diarrhea.  She does report that her throat feels swollen.    ED Course:  Pt was treated and monitored in ED for several hours.  Pt was given epinephrine, steroids, benadryl and tranexamic acid and 2 units of FFP was ordered.  Pt has been resistant to take the FFP at this time but family is coming to speak with her about it and she may decide to take it. Her labs and vitals have been good except a mild hypokalemia.  She is oxygenating well.   Admission is being requested for observation given the potential for worsening of the condition.    Review of Systems: All systems reviewed and apart from history of presenting illness, are negative.  Past Medical History:  Diagnosis Date  . Diabetes mellitus   . Hypertension    Past Surgical History:  Procedure Laterality Date  . ABDOMINAL HYSTERECTOMY    . BREAST REDUCTION SURGERY    .  KNEE SURGERY    . SLT LASER APPLICATION Left 9/62/2297   Procedure: SLT LASER APPLICATION;  Surgeon: Williams Che, MD;  Location: [facility];  Service: Ophthalmology;  Laterality: Left;   Social History:  reports that she has never smoked. She has never used smokeless tobacco. She reports current alcohol use. She reports that she does not use drugs.  Allergies  Allergen Reactions  . Diovan [Valsartan] Swelling    Tongue swells.    History reviewed. No pertinent family history.  Prior to Admission medications   Medication Sig Start Date End Date Taking? Authorizing Provider  amLODipine (NORVASC) 10 MG tablet Take 10 mg by mouth daily.   Yes [provider]  glimepiride (AMARYL) 4 MG tablet Take 4 mg by mouth daily before breakfast.   Yes [provider]  latanoprost (XALATAN) 0.005 % ophthalmic solution Place 1 drop into both eyes at bedtime.  11/09/17  Yes [provider]  metFORMIN (GLUCOPHAGE) 1000 MG tablet Take 1,000 mg by mouth 2 (two) times daily with a meal.   Yes [provider]  metoprolol (TOPROL-XL) 50 MG 24 hr tablet Take 50 mg by mouth daily.     Yes [provider]  simvastatin (ZOCOR) 10 MG tablet Take 10 mg by mouth daily.     Yes [provider]  TRESIBA FLEXTOUCH 200 UNIT/ML SOPN Inject 40-50 Units as  directed daily.  01/13/18  Yes [provider]  triamterene-hydrochlorothiazide (DYAZIDE) 37.5-25 MG per capsule Take 1 capsule by mouth daily.   Yes [provider]  TRULICITY 1.5 QM/0.8QP SOPN Inject 1.5 mg as directed daily.  02/01/18  Yes [provider]  Vitamin D, Ergocalciferol, (DRISDOL) 1.25 MG (50000 UT) CAPS capsule Take 50,000 Units by mouth every 7 (seven) days.  01/26/18  Yes [provider]   Physical Exam: Vitals:   02/04/18 0945 02/04/18 1000 02/04/18 1015 02/04/18 1030  BP: (!) 146/78 140/80 140/85 (!) 147/79  Pulse: 82 87 94 96  Resp: 17 19 16 20   Temp:        TempSrc:      SpO2: 95% 95% 96% 95%  Weight:      Height:         General exam: Moderately built and nourished patient, lying comfortably supine on the gurney in no obvious distress.  Head, eyes and ENT: Nontraumatic and normocephalic. Pupils equally reacting to light and accommodation. Mild tongue swelling noted.  No edema posterior pharynx seen.  Oral mucosa moist.  Neck: Supple. No JVD, carotid bruit or thyromegaly.  Lymphatics: No lymphadenopathy.  Respiratory system: Clear to auscultation. No increased work of breathing.  Cardiovascular system: S1 and S2 heard, RRR. No JVD, murmurs, gallops, clicks or pedal edema.  Gastrointestinal system: Abdomen is nondistended, soft and nontender. Normal bowel sounds heard. No organomegaly or masses appreciated.  Central nervous system: Alert and oriented. No focal neurological deficits.  Extremities: Symmetric 5 x 5 power. Peripheral pulses symmetrically felt.   Skin: No rashes or acute findings.  Musculoskeletal system: Negative exam.  Psychiatry: Pleasant and cooperative.  Labs on Admission:  Basic Metabolic Panel: Recent Labs  Lab 02/04/18 0812  NA 139  K 3.2*  CL 105  CO2 22  GLUCOSE 223*  BUN 16  CREATININE 0.90  CALCIUM 9.1   Liver Function Tests: No results for input(s): AST, ALT, ALKPHOS, BILITOT, PROT, ALBUMIN in the last 168 hours. No results for input(s): LIPASE, AMYLASE in the last 168 hours. No results for input(s): AMMONIA in the last 168 hours. CBC: Recent Labs  Lab 02/04/18 0812  WBC 7.4  NEUTROABS 3.0  HGB 14.0  HCT 44.4  MCV 86.9  PLT 248   Cardiac Enzymes: No results for input(s): CKTOTAL, CKMB, CKMBINDEX, TROPONINI in the last 168 hours.  BNP (last 3 results) No results for input(s): PROBNP in the last 8760 hours. CBG: No results for input(s): GLUCAP in the last 168 hours.  Radiological Exams on Admission: No results found.  EKG: Independently reviewed.    Assessment/Plan Principal Problem:   Angioedema Active Problems:   Allergic reaction   Hypertension   Diabetes mellitus   Hypokalemia   1. Recurrent angioedema- does not seem to be related to medication as she no longer takes ACE or ARB and has had no changes to her medications in several years.  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  We are going to admit her to the stepdown unit for observation.  She has decided to allow 2 units of FFP to be given.  The nurse is preparing to give it at this time.  She will be monitored closely.  I have recommended to her that she follow-up with hematology after discharge.  We will try to arrange an outpatient hematology follow-up.  She also should see an allergist. 2. Hypokalemia-IV replacement has been ordered.  Check magnesium and follow renal function panel in the  morning. 3. Type 2 diabetes mellitus with hyperglycemia- insulin requiring, Lantus has been ordered for basal insulin and sliding scale coverage ordered with frequent blood glucose testing.  We do expect that she will have some hyperglycemia in the setting of IV steroid use.  Also, prandial insulin has been ordered. 4. Hypertension- resumed her metoprolol however holding Zestoretic and amlodipine at this time.  IV hydralazine ordered for elevated blood pressure readings.   DVT Prophylaxis: Lovenox Code Status: Full Family Communication: Patient Disposition Plan: Observation in the stepdown unit  Time spent: 71 mins  Irwin Brakeman, MD [facility] 02/04/2018, 11:06 AM

## 2018-02-04 NOTE — ED Notes (Signed)
No reaction noted.  Tolerating FFP well.

## 2018-02-04 NOTE — ED Notes (Signed)
Pt says, "I feel a little better."

## 2018-02-04 NOTE — ED Triage Notes (Signed)
PT c/o tongue swelling, lip swelling and itching all over that woke her out of her sleep around 0500 today. PT states hx of angioedema from Diovan.

## 2018-02-04 NOTE — ED Provider Notes (Signed)
Emergency Department Provider Note   I have reviewed the triage vital signs and the nursing notes.   HISTORY  Chief Complaint Oral Swelling   HPI Brenda Pierce is a 63 y.o. female with PMH of DM, HTN, and angioedema presents to the emergency department with sensation of tongue swelling, lip swelling, tingling in the bilateral fingers.  Patient states that she has had several prior episodes of angioedema.  She initially told that her angioedema was due to medication she was taking was discontinued.  She states that since that time she has had several, mild episodes where she feels tingling in a certain area with very mild swelling but then goes away.  She estimates approximately 2 episodes like bad which improved with Benadryl at home.  Today, she awoke with tongue swelling and tingling in the fingers.  This seemed worse than her prior episodes so she presented to the emergency department.  She has not taken any medication prior to arrival.  No rash, shortness of breath, or throat tightness.  No difficulty swallowing.  Past Medical History:  Diagnosis Date  . Diabetes mellitus   . Hypertension     Patient Active Problem List   Diagnosis Date Noted  . Angioedema 02/04/2018  . Allergic reaction 02/04/2018  . Hypertension 02/04/2018  . Diabetes mellitus 02/04/2018  . Hypokalemia 02/04/2018    Past Surgical History:  Procedure Laterality Date  . ABDOMINAL HYSTERECTOMY    . BREAST REDUCTION SURGERY    . KNEE SURGERY    . SLT LASER APPLICATION Left 04/21/8117   Procedure: SLT LASER APPLICATION;  Surgeon: Williams Che, MD;  Location: [facility];  Service: Ophthalmology;  Laterality: Left;    Allergies Diovan [valsartan]  History reviewed. No pertinent family history.  Social History Social History   Tobacco Use  . Smoking status: Never Smoker  . Smokeless tobacco: Never Used  Substance Use Topics  . Alcohol use: Yes  . Drug use: No    Review of  Systems  Constitutional: No fever/chills Eyes: No visual changes. ENT: No sore throat. Positive tongue and lip swelling.  Cardiovascular: Denies chest pain. Respiratory: Denies shortness of breath. Gastrointestinal: No abdominal pain.  No nausea, no vomiting.  No diarrhea.  No constipation. Genitourinary: Negative for dysuria. Musculoskeletal: Negative for back pain. Skin: Negative for rash. Neurological: Negative for headaches, focal weakness or numbness.  10-point ROS otherwise negative.  ____________________________________________   PHYSICAL EXAM:  VITAL SIGNS: ED Triage Vitals [02/04/18 0720]  Enc Vitals Group     BP (!) 144/80     Pulse Rate 92     Resp 20     Temp 98 F (36.7 C)     Temp Source Oral     SpO2 98 %     Weight 198 lb (89.8 kg)     Height 5\' 5"  (1.651 m)     Pain Score 0   Constitutional: Alert and oriented. Well appearing and in no acute distress. Eyes: Conjunctivae are normal.  Head: Atraumatic. Nose: No congestion/rhinnorhea. Mouth/Throat: Mucous membranes are moist. Mild tongue enlargement. Patent posterior airway. Patient tolerating oral secretions. No appreciable lip edema at this time. Soft submandibular compartment.  Neck: No stridor.  Cardiovascular: Normal rate, regular rhythm. Good peripheral circulation. Grossly normal heart sounds.   Respiratory: Normal respiratory effort.  No retractions. Lungs CTAB. Gastrointestinal: No distention.  Musculoskeletal: No lower extremity tenderness nor edema. No gross deformities of extremities. Neurologic:  Normal speech and language. No gross focal  neurologic deficits are appreciated.  Skin:  Skin is warm, dry and intact. No rash noted.  ____________________________________________   LABS (all labs ordered are listed, but only abnormal results are displayed)  Labs Reviewed  BASIC METABOLIC PANEL - Abnormal; Notable for the following components:      Result Value   Potassium 3.2 (*)    Glucose,  Bld 223 (*)    All other components within normal limits  CBC WITH DIFFERENTIAL/PLATELET  MAGNESIUM  PREPARE FRESH FROZEN PLASMA  TYPE AND SCREEN   ____________________________________________  RADIOLOGY  None ____________________________________________   PROCEDURES  Procedure(s) performed:   Procedures  CRITICAL CARE Performed by: Margette Fast Total critical care time: 35 minutes Critical care time was exclusive of separately billable procedures and treating other patients. Critical care was necessary to treat or prevent imminent or life-threatening deterioration. Critical care was time spent personally by me on the following activities: development of treatment plan with patient and/or surrogate as well as nursing, discussions with consultants, evaluation of patient's response to treatment, examination of patient, obtaining history from patient or surrogate, ordering and performing treatments and interventions, ordering and review of laboratory studies, ordering and review of radiographic studies, pulse oximetry and re-evaluation of patient's condition.  Nanda Quinton, MD Emergency Medicine  ____________________________________________   INITIAL IMPRESSION / ASSESSMENT AND PLAN / ED COURSE  Pertinent labs & imaging results that were available during my care of the patient were reviewed by me and considered in my medical decision making (see chart for details).  Patient presents to the emergency department with tongue swelling is mild.  I do have some concern that this could progress and given the location I plan to treat aggressively.  Patient will receive epinephrine, Benadryl, steroid. Advised FFP but patient refused. Will give TXA and reassess frequently.   09:00 AM Patient states she is feeling slightly better but continues to have tongue swelling.  I discussed her decision to refuse the FFP.  We discussed that this medication should directly treat her problem and that  if it were to get worse she would require intubation.  Patient's primary concern is that I discussed this with her physician, Dr. Nevada Crane. I have had him paged. Discussed with patient regarding the risk of intubation and care delay. She verbalizes understanding.   Discussed patient's case with Hospitalist, Dr. Wynetta Emery to request admission. Patient and family (if present) updated with plan. Care transferred to Hospitalist service.  I reviewed all nursing notes, vitals, pertinent old records, EKGs, labs, imaging (as available).  11:30 AM After discussion with admit physician and daughter the patient did consent to receive FFP. Tongue swelling not worsening and reports some mild subjective improvement.   ____________________________________________  FINAL CLINICAL IMPRESSION(S) / ED DIAGNOSES  Final diagnoses:  Angioedema, initial encounter     MEDICATIONS GIVEN DURING THIS VISIT:  Medications  potassium chloride 10 mEq in 100 mL IVPB (has no administration in time range)  EPINEPHrine (EPI-PEN) injection 0.3 mg (0.3 mg Intramuscular Given 02/04/18 0758)  sodium chloride 0.9 % bolus 500 mL ( Intravenous New Bag/Given 02/04/18 0813)  diphenhydrAMINE (BENADRYL) injection 25 mg (25 mg Intravenous Given 02/04/18 0818)  methylPREDNISolone sodium succinate (SOLU-MEDROL) 125 mg/2 mL injection 125 mg (125 mg Intravenous Given 02/04/18 0814)  0.9 %  sodium chloride infusion (10 mL/hr Intravenous New Bag/Given 02/04/18 1120)  tranexamic acid (CYKLOKAPRON) IVPB 1,000 mg (0 mg Intravenous Stopped 02/04/18 0920)    Note:  This document was prepared using Dragon voice recognition software  and may include unintentional dictation errors.  Nanda Quinton, MD Emergency Medicine    Lannie Heaps, [facility], MD 02/04/18 [phone]

## 2018-02-04 NOTE — ED Notes (Signed)
Pt refusing  FFP.  Dr Laverta Baltimore informed and lab tech informed to hold off on FFP until further notice.

## 2018-02-04 NOTE — Progress Notes (Signed)
Pt refused Incentive spirometry.

## 2018-02-04 NOTE — ED Notes (Signed)
No reaction noted to FFP.  Tolerating well.

## 2018-02-04 NOTE — ED Notes (Signed)
Attempted multiple times to enter in FFP information under MAR and would not save and cross over correct expiration date. Blood bank personal had difficulty labeling while issuing FFP.

## 2018-02-05 ENCOUNTER — Encounter (HOSPITAL_COMMUNITY): Payer: Self-pay | Admitting: Family Medicine

## 2018-02-05 DIAGNOSIS — T783XXD Angioneurotic edema, subsequent encounter: Secondary | ICD-10-CM | POA: Diagnosis not present

## 2018-02-05 DIAGNOSIS — I10 Essential (primary) hypertension: Secondary | ICD-10-CM | POA: Diagnosis not present

## 2018-02-05 DIAGNOSIS — T7840XD Allergy, unspecified, subsequent encounter: Secondary | ICD-10-CM | POA: Diagnosis not present

## 2018-02-05 DIAGNOSIS — E876 Hypokalemia: Secondary | ICD-10-CM | POA: Diagnosis not present

## 2018-02-05 LAB — PREPARE FRESH FROZEN PLASMA: Unit division: 0

## 2018-02-05 LAB — RENAL FUNCTION PANEL
Albumin: 3.7 g/dL (ref 3.5–5.0)
Anion gap: 9 (ref 5–15)
BUN: 17 mg/dL (ref 8–23)
CO2: 22 mmol/L (ref 22–32)
CREATININE: 0.79 mg/dL (ref 0.44–1.00)
Calcium: 9.3 mg/dL (ref 8.9–10.3)
Chloride: 104 mmol/L (ref 98–111)
GFR calc Af Amer: >60 mL/min (ref >60)
GFR calc non Af Amer: >60 mL/min (ref >60)
Glucose, Bld: 243 mg/dL — ABNORMAL HIGH (ref 70–99)
Phosphorus: 3.7 mg/dL (ref 2.5–4.6)
Potassium: 3.7 mmol/L (ref 3.5–5.1)
Sodium: 135 mmol/L (ref 135–145)

## 2018-02-05 LAB — BPAM FFP
Blood Product Expiration Date: 202001282359
Blood Product Expiration Date: 202001282359
ISSUE DATE / TIME: 202001231147
ISSUE DATE / TIME: 202001231637
Unit Type and Rh: 5100
Unit Type and Rh: 5100

## 2018-02-05 LAB — MAGNESIUM: Magnesium: 2.3 mg/dL (ref 1.7–2.4)

## 2018-02-05 LAB — MRSA PCR SCREENING: MRSA by PCR: NEGATIVE

## 2018-02-05 LAB — GLUCOSE, CAPILLARY: Glucose-Capillary: 215 mg/dL — ABNORMAL HIGH (ref 70–99)

## 2018-02-05 MED ORDER — KETOROLAC TROMETHAMINE 15 MG/ML IJ SOLN
15.0000 mg | Freq: Four times a day (QID) | INTRAMUSCULAR | Status: DC | PRN
  Administered 2018-02-05: 15 mg via INTRAVENOUS
  Filled 2018-02-05: qty 1

## 2018-02-05 MED ORDER — LORATADINE 10 MG PO TABS: 10.0000 mg | ORAL_TABLET | Freq: Every day | ORAL | 0 refills | Status: AC

## 2018-02-05 MED ORDER — FAMOTIDINE 20 MG PO TABS: 20.0000 mg | ORAL_TABLET | Freq: Every day | ORAL | 0 refills | Status: AC

## 2018-02-05 MED ORDER — PREDNISONE 10 MG PO TABS: 10.0000 mg | ORAL_TABLET | Freq: Every day | ORAL | 0 refills | Status: AC

## 2018-02-05 MED ORDER — HYDROCODONE-ACETAMINOPHEN 5-325 MG PO TABS
1.0000 | ORAL_TABLET | Freq: Four times a day (QID) | ORAL | Status: DC | PRN
  Administered 2018-02-05: 2 via ORAL
  Filled 2018-02-05: qty 2

## 2018-02-05 NOTE — Evaluation (Signed)
Physical Therapy Evaluation Patient Details Name: Brenda Pierce MRN: [id number] DOB: April 25, 1955 Today's Date: 02/05/2018   History of Present Illness  Brenda Pierce is a 63 y.o. female with history of angioedema presented to ED complaining of tongue swelling and itching all over body that woke her up from sleeping around 5 am.  She says that she felt very similar to when she has had angioedema in the past when she was taking diovan.  This was about 7 years ago.  She is no longer taking any ACE or ARB.  Today, she experienced the sensation of tongue and lip swelling and tingling in both fingers associated with itching over entire body and it woke her up from sleeping.  She has had several mild cases of angioedema in the last 7 years since she discontinued the medications that she thought was causing it. She self medicated and treated at home with benadryl for at least 2 prior episodes and the symptoms eventually resolved. This time she became afraid because the symptoms seemed more severe.  She denies rash, chest pain, SOB, nausea, emesis, diarrhea.  She does report that her throat feels swollen.    Clinical Impression  Patient functioning at baseline for functional mobility and gait.  Plan:  Patient discharged from physical therapy to care of nursing for ambulation daily as tolerated for length of stay.    Follow Up Recommendations No PT follow up    Equipment Recommendations  None recommended by PT    Recommendations for Other Services       Precautions / Restrictions Precautions Precautions: None Restrictions Weight Bearing Restrictions: No      Mobility  Bed Mobility                  Transfers Overall transfer level: Independent                  Ambulation/Gait Ambulation/Gait assistance: Independent Gait Distance (Feet): 100 Feet Assistive device: None Gait Pattern/deviations: WFL(Within Functional Limits) Gait velocity: normal   General Gait Details:  no loss of balance  Stairs            Wheelchair Mobility    Modified Rankin (Stroke Patients Only)       Balance Overall balance assessment: Independent                                           Pertinent Vitals/Pain Pain Assessment: No/denies pain    Home Living Family/patient expects to be discharged to:: Private residence Living Arrangements: Alone Available Help at Discharge: Family Type of Home: House Home Access: Level entry     Home Layout: Multi-level;Laundry or work area in [employer]: None      Prior Function Level of Independence: Independent         Comments: Hydrographic surveyor, drives     Journalist, newspaper        Extremity/Trunk Assessment   Upper Extremity Assessment Upper Extremity Assessment: Overall WFL for tasks assessed    Lower Extremity Assessment Lower Extremity Assessment: Overall WFL for tasks assessed    Cervical / Trunk Assessment Cervical / Trunk Assessment: Normal  Communication   Communication: No difficulties  Cognition Arousal/Alertness: Awake/alert Behavior During Therapy: WFL for tasks assessed/performed Overall Cognitive Status: Within Functional Limits for tasks assessed  General Comments      Exercises     Assessment/Plan    PT Assessment Patent does not need any further PT services  PT Problem List         PT Treatment Interventions      PT Goals (Current goals can be found in the Care Plan section)  Acute Rehab PT Goals Patient Stated Goal: return home PT Goal Formulation: With patient Time For Goal Achievement: 02/05/18 Potential to Achieve Goals: Good    Frequency     Barriers to discharge        Co-evaluation               AM-PAC PT "6 Clicks" Mobility  Outcome Measure Help needed turning from your back to your side while in a flat bed without using bedrails?: None Help needed moving  from lying on your back to sitting on the side of a flat bed without using bedrails?: None Help needed moving to and from a bed to a chair (including a wheelchair)?: None Help needed standing up from a chair using your arms (e.g., wheelchair or bedside chair)?: None Help needed to walk in hospital room?: None Help needed climbing 3-5 steps with a railing? : None 6 Click Score: 24    End of Session   Activity Tolerance: Patient tolerated treatment well Patient left: in bed(seated at bedside) Nurse Communication: Mobility status PT Visit Diagnosis: Unsteadiness on feet (R26.81);Other abnormalities of gait and mobility (R26.89);Muscle weakness (generalized) (M62.81)    Time: 8676-1950 PT Time Calculation (min) (ACUTE ONLY): 14 min   Charges:   PT Evaluation $PT Eval Low Complexity: 1 Low PT Treatments $Gait Training: 8-22 mins        12:37 PM, 02/05/18 Lonell Grandchild, MPT Physical Therapist with [facility] [phone] office [phone] mobile phone

## 2018-02-05 NOTE — Progress Notes (Signed)
Pt discharged from facility with stable vital signs and no complaints of pain. Discharge instructions given to pt and gone over by this RN. Pt verbalized understanding.   Jeris Penta, RN

## 2018-02-05 NOTE — Discharge Summary (Signed)
Physician Discharge Summary  Brenda Pierce PJA:[id number] DOB: February 26, 1955 DOA: 02/04/2018  PCP: Celene Squibb, MD  Admit date: 02/04/2018 Discharge date: 02/05/2018  Admitted From:  HOME  Disposition:  HOME   Recommendations for Outpatient Follow-up:  1. Follow up with PCP in 3 days 2. Establish care with Dr. Delton [facility] in 2 weeks 3. Please refer to allergist 4. Holding trulicity as it may cause angioedema 5. Holding amlodipine/zestoretic as they can cause hypersensitivity reactions (discuss restarting with PCP)  Discharge Condition: STABLE   CODE STATUS: FULL    Brief Hospitalization Summary: Please see all hospital notes, images, labs for full details of the hospitalization. HPI: Brenda Pierce is a 63 y.o. female with history of angioedema presented to ED complaining of tongue swelling and itching all over body that woke her up from sleeping around 5 am.  She says that she felt very similar to when she has had angioedema in the past when she was taking diovan.  This was about 7 years ago.  She is no longer taking any ACE or ARB.  Today, she experienced the sensation of tongue and lip swelling and tingling in both fingers associated with itching over entire body and it woke her up from sleeping.  She has had several mild cases of angioedema in the last 7 years since she discontinued the medications that she thought was causing it. She self medicated and treated at home with benadryl for at least 2 prior episodes and the symptoms eventually resolved. This time she became afraid because the symptoms seemed more severe.  She denies rash, chest pain, SOB, nausea, emesis, diarrhea.  She does report that her throat feels swollen.    ED Course:  Pt was treated and monitored in ED for several hours.  Pt was given epinephrine, steroids, benadryl and tranexamic acid and 2 units of FFP was ordered.  Pt has been resistant to take the FFP at this time but family is coming to speak with her  about it and she may decide to take it. Her labs and vitals have been good except a mild hypokalemia.  She is oxygenating well.   Admission is being requested for observation given the potential for worsening of the condition.    1. Recurrent angioedema- RESOLVED NOW.  Pt is s/p 2 units FFP.  I will not restart trulicity at discharge as this can cause angioedema.  Also holding amlodipine and zestoretic as they reportedly can cause hypersensitivity reactions. Discharge on prednisone/loratadine/pepcid.  Follow up with PCP in 3 days for recheck.  Pt advised to return if symptoms come back.   I have recommended to her that she follow-up with hematology after discharge.  Recommended an outpatient hematology follow-up.  She also should see an allergist which her PCP can refer her to one.   Pt says that she is at her baseline and not having any difficulty eating, swallowing, breathing, no throat or tongue swelling.  2. Hypokalemia-RESOLVED.  IV replacement has been ordered.  3. Type 2 diabetes mellitus with hyperglycemia- HOLDING TRULICITY.  Resume other home diabetes treatment regimen.  4. Hypertension- resumed her metoprolol however holding Zestoretic and amlodipine until patient can follow up with PCP to decide about restarting.   DVT Prophylaxis: Lovenox Code Status: Full Family Communication: Patient Disposition Plan: Home   Discharge Diagnoses:  Principal Problem:   Angioedema Active Problems:   Allergic reaction   Hypertension   Diabetes mellitus   Hypokalemia  Discharge Instructions: Discharge Instructions  Call MD for:  difficulty breathing, headache or visual disturbances   Complete by:  As directed    Call MD for:  hives   Complete by:  As directed      Allergies as of 02/05/2018      Reactions   Diovan [valsartan] Swelling   Tongue swells.      Medication List    STOP taking these medications   amLODipine 10 MG tablet Commonly known as:  NORVASC    triamterene-hydrochlorothiazide 37.5-25 MG capsule Commonly known as:  DYAZIDE   TRULICITY 1.5 EX/9.3ZJ Sopn Generic drug:  Dulaglutide     TAKE these medications   famotidine 20 MG tablet Commonly known as:  PEPCID Take 1 tablet (20 mg total) by mouth daily for 14 days.   glimepiride 4 MG tablet Commonly known as:  AMARYL Take 4 mg by mouth daily before breakfast.   latanoprost 0.005 % ophthalmic solution Commonly known as:  XALATAN Place 1 drop into both eyes at bedtime.   loratadine 10 MG tablet Commonly known as:  CLARITIN Take 1 tablet (10 mg total) by mouth daily for 30 days. Start taking on:  February 06, 2018   metFORMIN 1000 MG tablet Commonly known as:  GLUCOPHAGE Take 1,000 mg by mouth 2 (two) times daily with a meal.   metoprolol succinate 50 MG 24 hr tablet Commonly known as:  TOPROL-XL Take 50 mg by mouth daily.   predniSONE 10 MG tablet Commonly known as:  DELTASONE Take 1 tablet (10 mg total) by mouth daily with breakfast for 5 days. Start taking on:  February 06, 2018   simvastatin 10 MG tablet Commonly known as:  ZOCOR Take 10 mg by mouth daily.   TRESIBA FLEXTOUCH 200 UNIT/ML Sopn Generic drug:  Insulin Degludec Inject 40-50 Units as directed daily.   Vitamin D (Ergocalciferol) 1.25 MG (50000 UT) Caps capsule Commonly known as:  DRISDOL Take 50,000 Units by mouth every 7 (seven) days.      Follow-up Information    Celene Squibb, MD. Schedule an appointment as soon as possible for a visit in 3 day(s).   Specialty:  Internal Medicine Why:  Hospital Follow Up  Contact information: [address] [place] [address] [phone]        Derek Jack, MD. Schedule an appointment as soon as possible for a visit in 2 week(s).   Specialty:  Hematology Why:  Establish Care for recurrent angioedema Contact information: [place] [address] [phone]          Allergies  Allergen Reactions  . Diovan  [Valsartan] Swelling    Tongue swells.   Allergies as of 02/05/2018      Reactions   Diovan [valsartan] Swelling   Tongue swells.      Medication List    STOP taking these medications   amLODipine 10 MG tablet Commonly known as:  NORVASC   triamterene-hydrochlorothiazide 37.5-25 MG capsule Commonly known as:  DYAZIDE   TRULICITY 1.5 RW/4.3XV Sopn Generic drug:  Dulaglutide     TAKE these medications   famotidine 20 MG tablet Commonly known as:  PEPCID Take 1 tablet (20 mg total) by mouth daily for 14 days.   glimepiride 4 MG tablet Commonly known as:  AMARYL Take 4 mg by mouth daily before breakfast.   latanoprost 0.005 % ophthalmic solution Commonly known as:  XALATAN Place 1 drop into both eyes at bedtime.   loratadine 10 MG tablet Commonly known  as:  CLARITIN Take 1 tablet (10 mg total) by mouth daily for 30 days. Start taking on:  February 06, 2018   metFORMIN 1000 MG tablet Commonly known as:  GLUCOPHAGE Take 1,000 mg by mouth 2 (two) times daily with a meal.   metoprolol succinate 50 MG 24 hr tablet Commonly known as:  TOPROL-XL Take 50 mg by mouth daily.   predniSONE 10 MG tablet Commonly known as:  DELTASONE Take 1 tablet (10 mg total) by mouth daily with breakfast for 5 days. Start taking on:  February 06, 2018   simvastatin 10 MG tablet Commonly known as:  ZOCOR Take 10 mg by mouth daily.   TRESIBA FLEXTOUCH 200 UNIT/ML Sopn Generic drug:  Insulin Degludec Inject 40-50 Units as directed daily.   Vitamin D (Ergocalciferol) 1.25 MG (50000 UT) Caps capsule Commonly known as:  DRISDOL Take 50,000 Units by mouth every 7 (seven) days.       Procedures/Studies:  No results found.   Subjective: Pt says that he symptoms have completely resolved. No tongue swelling, no shortness of breath, no difficulty swallowing and no difficulty with breathing.    Discharge Exam: Vitals:   02/05/18 0700 02/05/18 0800  BP: (!) 145/81 (!) 176/149  Pulse: 88  91  Resp: 20 (!) 25  Temp:  98.6 F (37 C)  SpO2: 95% 97%   Vitals:   02/05/18 0500 02/05/18 0600 02/05/18 0700 02/05/18 0800  BP: (!) 145/72 140/78 (!) 145/81 (!) 176/149  Pulse: 82 70 88 91  Resp: 11 16 20  (!) 25  Temp:    98.6 F (37 C)  TempSrc:      SpO2: 97% 95% 95% 97%  Weight:      Height:        General: Pt is alert, awake, not in acute distress, throat clear no signs of swelling of tongue or oropharynx Cardiovascular: RRR, S1/S2 +, no rubs, no gallops Respiratory: CTA bilaterally, no wheezing, no rhonchi Abdominal: Soft, NT, ND, bowel sounds + Extremities: no edema, no cyanosis   The results of significant diagnostics from this hospitalization (including imaging, microbiology, ancillary and laboratory) are listed below for reference.     Microbiology: Recent Results (from the past 240 hour(s))  MRSA PCR Screening     Status: None   Collection Time: 02/04/18  2:04 PM  Result Value Ref Range Status   MRSA by PCR NEGATIVE NEGATIVE Final    Comment:        The GeneXpert MRSA Assay (FDA approved for NASAL specimens only), is one component of a comprehensive MRSA colonization surveillance program. It is not intended to diagnose MRSA infection nor to guide or monitor treatment for MRSA infections. Performed at [facility], [address], [place], [place] [address]     Labs: BNP (last 3 results) No results for input(s): BNP in the last 8760 hours. Basic Metabolic Panel: Recent Labs  Lab 02/04/18 0812 02/05/18 0407  NA 139 135  K 3.2* 3.7  CL 105 104  CO2 22 22  GLUCOSE 223* 243*  BUN 16 17  CREATININE 0.90 0.79  CALCIUM 9.1 9.3  MG 2.1 2.3  PHOS  --  3.7   Liver Function Tests: Recent Labs  Lab 02/05/18 0407  ALBUMIN 3.7   No results for input(s): LIPASE, AMYLASE in the last 168 hours. No results for input(s): AMMONIA in the last 168 hours. CBC: Recent Labs  Lab 02/04/18 0812  WBC 7.4  NEUTROABS 3.0  HGB 14.0  HCT 44.4  MCV 86.9   PLT 248   Cardiac Enzymes: No results for input(s): CKTOTAL, CKMB, CKMBINDEX, TROPONINI in the last 168 hours. BNP: Invalid input(s): POCBNP CBG: Recent Labs  Lab 02/04/18 1607 02/04/18 2120 02/05/18 0751  GLUCAP 313* 283* 215*   D-Dimer No results for input(s): DDIMER in the last 72 hours. Hgb A1c Recent Labs    02/04/18 1403  HGBA1C 8.6*   Lipid Profile No results for input(s): CHOL, HDL, LDLCALC, TRIG, CHOLHDL, LDLDIRECT in the last 72 hours. Thyroid function studies No results for input(s): TSH, T4TOTAL, T3FREE, THYROIDAB in the last 72 hours.  Invalid input(s): FREET3 Anemia work up No results for input(s): VITAMINB12, FOLATE, FERRITIN, TIBC, IRON, RETICCTPCT in the last 72 hours. Urinalysis    Component Value Date/Time   COLORURINE YELLOW 12/09/2011 1018   APPEARANCEUR CLEAR 12/09/2011 1018   LABSPEC 1.025 12/09/2011 1018   PHURINE 6.0 12/09/2011 1018   GLUCOSEU NEGATIVE 12/09/2011 1018   HGBUR NEGATIVE 12/09/2011 1018   BILIRUBINUR NEGATIVE 12/09/2011 1018   KETONESUR NEGATIVE 12/09/2011 1018   PROTEINUR NEGATIVE 12/09/2011 1018   UROBILINOGEN 0.2 12/09/2011 1018   NITRITE NEGATIVE 12/09/2011 1018   LEUKOCYTESUR NEGATIVE 12/09/2011 1018   Sepsis Labs Invalid input(s): PROCALCITONIN,  WBC,  LACTICIDVEN Microbiology Recent Results (from the past 240 hour(s))  MRSA PCR Screening     Status: None   Collection Time: 02/04/18  2:04 PM  Result Value Ref Range Status   MRSA by PCR NEGATIVE NEGATIVE Final    Comment:        The GeneXpert MRSA Assay (FDA approved for NASAL specimens only), is one component of a comprehensive MRSA colonization surveillance program. It is not intended to diagnose MRSA infection nor to guide or monitor treatment for MRSA infections. Performed at [facility], [address] [place], [place] [address]    Time coordinating discharge:   SIGNED:  Irwin Brakeman, MD

## 2018-02-05 NOTE — Discharge Instructions (Signed)
Seek Medical Care or return to ER if symptoms come back, worsen, or new problems develop.   Please stop taking trulicity as this can cause angioedema and hypersensitivity reactions.     Angioedema  Angioedema is sudden swelling in the body. The swelling can happen in any part of the body. It often happens on the skin and causes itchy, bumpy patches (hives) to form. This condition may:  Happen only one time.  Happen more than one time. It may come back at random times.  Keep coming back for a number of years. Someday it may stop coming back. Follow these instructions at home:  Take over-the-counter and prescription medicines only as told by your doctor.  If you were given medicines for emergency allergy treatment, always carry them with you.  Wear a medical bracelet as told by your doctor.  Avoid the things that cause your attacks (triggers).  If this condition was passed to you from your parents and you want to have kids, talk to your doctor. Your kids may also have this condition. Contact a doctor if:  You have another attack.  Your attacks happen more often, even after you take steps to prevent them.  This condition was passed to you by your parents and you want to have kids. Get help right away if:  Your mouth, tongue, or lips get very swollen.  You have trouble breathing.  You have trouble swallowing.  You pass out (faint). This information is not intended to replace advice given to you by your health care provider. Make sure you discuss any questions you have with your health care provider. Document Released: 12/18/2008 Document Revised: 08/01/2015 Document Reviewed: 07/10/2015 Elsevier Interactive Patient Education  2019 [employer].

## 2018-02-06 LAB — HIV ANTIBODY (ROUTINE TESTING W REFLEX): HIV Screen 4th Generation wRfx: NONREACTIVE

## 2018-04-02 ENCOUNTER — Ambulatory Visit: Payer: Self-pay | Admitting: Allergy & Immunology

## 2018-10-01 ENCOUNTER — Other Ambulatory Visit: Payer: Self-pay | Admitting: Adult Health Nurse Practitioner

## 2018-12-14 ENCOUNTER — Other Ambulatory Visit: Payer: Self-pay

## 2018-12-14 DIAGNOSIS — Z20822 Contact with and (suspected) exposure to covid-19: Secondary | ICD-10-CM

## 2018-12-16 LAB — NOVEL CORONAVIRUS, NAA: SARS-CoV-2, NAA: NOT DETECTED

## 2018-12-17 ENCOUNTER — Telehealth: Payer: Self-pay

## 2018-12-17 NOTE — Telephone Encounter (Signed)
Patient called and she was informed that her COVID-19 test 12/14/2018 was negative.  She verbalized understanding.

## 2019-03-09 ENCOUNTER — Ambulatory Visit (HOSPITAL_COMMUNITY): Admission: RE | Admit: 2019-03-09 | Payer: BC Managed Care – PPO | Source: Ambulatory Visit

## 2019-03-09 ENCOUNTER — Other Ambulatory Visit (HOSPITAL_COMMUNITY): Payer: Self-pay | Admitting: Internal Medicine

## 2019-03-09 ENCOUNTER — Other Ambulatory Visit: Payer: Self-pay | Admitting: Internal Medicine

## 2019-03-09 DIAGNOSIS — E041 Nontoxic single thyroid nodule: Secondary | ICD-10-CM

## 2019-03-11 ENCOUNTER — Other Ambulatory Visit: Payer: Self-pay

## 2019-03-11 ENCOUNTER — Ambulatory Visit (HOSPITAL_COMMUNITY)
Admission: RE | Admit: 2019-03-11 | Discharge: 2019-03-11 | Disposition: A | Discharge status: Home / Self Care | Payer: BC Managed Care – PPO | Source: Ambulatory Visit | Attending: Internal Medicine | Admitting: Internal Medicine

## 2019-03-11 DIAGNOSIS — E041 Nontoxic single thyroid nodule: Secondary | ICD-10-CM | POA: Diagnosis not present

## 2019-03-20 ENCOUNTER — Ambulatory Visit: Payer: BC Managed Care – PPO | Attending: Internal Medicine

## 2019-03-20 DIAGNOSIS — Z23 Encounter for immunization: Secondary | ICD-10-CM

## 2019-03-20 NOTE — Progress Notes (Signed)
   Covid-19 Vaccination Clinic  Name:  Brenda Pierce    MRN: [id number] DOB: 19-Dec-1955  03/20/2019  Brenda Pierce was observed post Covid-19 immunization for 15 minutes without incident. She was provided with Vaccine Information Sheet and instruction to access the V-Safe system.   Brenda Pierce was instructed to call 911 with any severe reactions post vaccine: Marland Kitchen Difficulty breathing  . Swelling of face and throat  . A fast heartbeat  . A bad rash all over body  . Dizziness and weakness   Immunizations Administered    Name Date Dose VIS Date Route   Pfizer COVID-19 Vaccine 03/20/2019 10:55 AM 0.3 mL 12/24/2018 Intramuscular   Manufacturer: [place]   Lot: [id number]   [place]: [id number]

## 2019-04-10 ENCOUNTER — Ambulatory Visit: Payer: BC Managed Care – PPO | Attending: Internal Medicine

## 2019-04-10 DIAGNOSIS — Z23 Encounter for immunization: Secondary | ICD-10-CM

## 2019-04-10 NOTE — Progress Notes (Signed)
   Covid-19 Vaccination Clinic  Name:  Brenda Pierce    MRN: [id number] DOB: 1955-03-13  04/10/2019  [name] was observed post Covid-19 immunization for 30 minutes based on pre-vaccination screening without incident. She was provided with Vaccine Information Sheet and instruction to access the V-Safe system.   [name] was instructed to call 911 with any severe reactions post vaccine: Marland Kitchen Difficulty breathing  . Swelling of face and throat  . A fast heartbeat  . A bad rash all over body  . Dizziness and weakness   Immunizations Administered    Name Date Dose VIS Date Route   Pfizer COVID-19 Vaccine 04/10/2019 10:39 AM 0.3 mL 12/24/2018 Intramuscular   Manufacturer: [place]   Lot: [id number]   [place]: [id number]

## 2019-05-10 ENCOUNTER — Other Ambulatory Visit: Payer: Self-pay | Admitting: Surgery

## 2019-05-10 DIAGNOSIS — E041 Nontoxic single thyroid nodule: Secondary | ICD-10-CM

## 2019-05-31 ENCOUNTER — Other Ambulatory Visit (HOSPITAL_COMMUNITY)
Admission: RE | Admit: 2019-05-31 | Discharge: 2019-05-31 | Disposition: A | Discharge status: Home / Self Care | Payer: BC Managed Care – PPO | Source: Ambulatory Visit | Attending: Physician Assistant | Admitting: Physician Assistant

## 2019-05-31 ENCOUNTER — Ambulatory Visit
Admission: RE | Admit: 2019-05-31 | Discharge: 2019-05-31 | Disposition: A | Discharge status: Home / Self Care | Payer: BC Managed Care – PPO | Source: Ambulatory Visit | Attending: Surgery | Admitting: Surgery

## 2019-05-31 DIAGNOSIS — E041 Nontoxic single thyroid nodule: Secondary | ICD-10-CM | POA: Diagnosis present

## 2019-05-31 DIAGNOSIS — D34 Benign neoplasm of thyroid gland: Secondary | ICD-10-CM | POA: Insufficient documentation

## 2019-06-02 LAB — CYTOLOGY - NON PAP

## 2019-11-04 ENCOUNTER — Other Ambulatory Visit: Payer: Self-pay | Admitting: Surgery

## 2019-11-04 DIAGNOSIS — E041 Nontoxic single thyroid nodule: Secondary | ICD-10-CM

## 2019-11-22 ENCOUNTER — Ambulatory Visit
Admission: RE | Admit: 2019-11-22 | Discharge: 2019-11-22 | Disposition: A | Discharge status: Home / Self Care | Payer: BC Managed Care – PPO | Source: Ambulatory Visit | Attending: Surgery | Admitting: Surgery

## 2019-11-22 DIAGNOSIS — E041 Nontoxic single thyroid nodule: Secondary | ICD-10-CM

## 2021-02-06 ENCOUNTER — Other Ambulatory Visit: Payer: Self-pay | Admitting: Family Medicine

## 2021-02-06 ENCOUNTER — Other Ambulatory Visit (HOSPITAL_COMMUNITY): Payer: Self-pay | Admitting: Family Medicine

## 2021-02-06 DIAGNOSIS — R202 Paresthesia of skin: Secondary | ICD-10-CM

## 2021-02-08 ENCOUNTER — Other Ambulatory Visit: Payer: Self-pay

## 2021-02-08 ENCOUNTER — Ambulatory Visit (HOSPITAL_COMMUNITY)
Admission: RE | Admit: 2021-02-08 | Discharge: 2021-02-08 | Disposition: A | Discharge status: Home / Self Care | Payer: BC Managed Care – PPO | Source: Ambulatory Visit | Attending: Family Medicine | Admitting: Family Medicine

## 2021-02-08 DIAGNOSIS — R202 Paresthesia of skin: Secondary | ICD-10-CM | POA: Diagnosis present

## 2021-08-03 ENCOUNTER — Encounter (HOSPITAL_COMMUNITY): Payer: Self-pay

## 2021-08-03 ENCOUNTER — Emergency Department (HOSPITAL_COMMUNITY): Payer: BC Managed Care – PPO

## 2021-08-03 ENCOUNTER — Other Ambulatory Visit: Payer: Self-pay

## 2021-08-03 ENCOUNTER — Emergency Department (HOSPITAL_COMMUNITY)
Admission: EM | Admit: 2021-08-03 | Discharge: 2021-08-03 | Disposition: A | Discharge status: Home / Self Care | Payer: BC Managed Care – PPO | Attending: Emergency Medicine | Admitting: Emergency Medicine

## 2021-08-03 DIAGNOSIS — I10 Essential (primary) hypertension: Secondary | ICD-10-CM | POA: Insufficient documentation

## 2021-08-03 DIAGNOSIS — Z7984 Long term (current) use of oral hypoglycemic drugs: Secondary | ICD-10-CM | POA: Diagnosis not present

## 2021-08-03 DIAGNOSIS — E119 Type 2 diabetes mellitus without complications: Secondary | ICD-10-CM | POA: Diagnosis not present

## 2021-08-03 DIAGNOSIS — S4991XA Unspecified injury of right shoulder and upper arm, initial encounter: Secondary | ICD-10-CM | POA: Diagnosis present

## 2021-08-03 DIAGNOSIS — S40011A Contusion of right shoulder, initial encounter: Secondary | ICD-10-CM

## 2021-08-03 DIAGNOSIS — Z79899 Other long term (current) drug therapy: Secondary | ICD-10-CM | POA: Diagnosis not present

## 2021-08-03 DIAGNOSIS — W010XXA Fall on same level from slipping, tripping and stumbling without subsequent striking against object, initial encounter: Secondary | ICD-10-CM | POA: Diagnosis not present

## 2021-08-03 MED ORDER — IBUPROFEN 600 MG PO TABS: 600.0000 mg | ORAL_TABLET | Freq: Four times a day (QID) | ORAL | 0 refills | Status: AC | PRN

## 2021-08-03 MED ORDER — IBUPROFEN 800 MG PO TABS
800.0000 mg | ORAL_TABLET | Freq: Once | ORAL | Status: AC
  Administered 2021-08-03: 800 mg via ORAL
  Filled 2021-08-03: qty 1

## 2021-08-03 NOTE — Discharge Instructions (Addendum)
You may need referral to an orthopedist if the pain continues, I suspect that there is a small rotator cuff tear but this should get better over time.  It may often take weeks or even months to totally get back to normal.  Your x-rays did not show any signs of broken bones.  It did show that you have some calcifications in your rotator cuff which may predispose it to some injuries when you fall on it.  This makes it more likely that this is a rotator cuff injury.  You will likely want to follow-up with your doctor this week and if it is not much better they will send you to the orthopedic surgeon for further evaluation and possible physical therapy  Please take ibuprofen 600 mg 3 times a day as needed for pain, apply ice intermittently and use the sling for comfort however I do want you to do range of motion exercises every day to avoid a frozen shoulder.  Please call your family doctor to be rechecked within the week.

## 2021-08-03 NOTE — ED Provider Notes (Signed)
[facility] EMERGENCY DEPARTMENT Provider Note   CSN: [id number] Arrival date & time: 08/03/21  2057     History  Chief Complaint  Patient presents with   Brenda Pierce is a 66 y.o. female.   Fall  This patient is a very pleasant 66 year old female, she has a few medical problems including diabetes for which she takes [place], metformin, and glimepiride.  She also takes metoprolol for high blood pressure and simvastatin for cholesterol.  She reports that she was in her usual state of health when she was walking through her house that was being remodeled this evening.  There was a hanging blanket between 2 parts of the room which caught her feet causing her to fall out onto her right arm, she took the impact on her right shoulder she fell to the ground.  No head injury no loss of consciousness no leg injuries no numbness or weakness of the arm, it does hurt when she moves her shoulder.  Denies deformity, no medications given prior to arrival.     Home Medications Prior to Admission medications   Medication Sig Start Date End Date Taking? Authorizing Provider  ibuprofen (ADVIL) 600 MG tablet Take 1 tablet (600 mg total) by mouth every 6 (six) hours as needed. 08/03/21  Yes Noemi Chapel, MD  famotidine (PEPCID) 20 MG tablet Take 1 tablet (20 mg total) by mouth daily for 14 days. 02/05/18 02/19/18  Johnson, Clanford L, MD  glimepiride (AMARYL) 4 MG tablet Take 4 mg by mouth daily before breakfast.    [provider]  latanoprost (XALATAN) 0.005 % ophthalmic solution Place 1 drop into both eyes at bedtime.  11/09/17   [provider]  loratadine (CLARITIN) 10 MG tablet Take 1 tablet (10 mg total) by mouth daily for 30 days. 02/06/18 03/08/18  Johnson, Clanford L, MD  metFORMIN (GLUCOPHAGE) 1000 MG tablet Take 1,000 mg by mouth 2 (two) times daily with a meal.    [provider]  metoprolol (TOPROL-XL) 50 MG 24 hr tablet Take 50 mg by mouth daily.       [provider]  simvastatin (ZOCOR) 10 MG tablet Take 10 mg by mouth daily.      [provider]  TRESIBA FLEXTOUCH 200 UNIT/ML SOPN Inject 40-50 Units as directed daily.  01/13/18   [provider]  Vitamin D, Ergocalciferol, (DRISDOL) 1.25 MG (50000 UT) CAPS capsule Take 50,000 Units by mouth every 7 (seven) days.  01/26/18   [provider]      Allergies    Diovan [valsartan]    Review of Systems   Review of Systems  Skin:  Negative for wound.  Neurological:  Negative for weakness and numbness.    Physical Exam Updated Vital Signs BP (!) 181/73 (BP Location: Left Arm)   Pulse 70   Temp 98.3 F (36.8 C) (Oral)   Resp 15   Ht 1.651 m ('5\' 5"'$ )   Wt 95.3 kg   SpO2 97%   BMI 34.95 kg/m  Physical Exam Vitals and nursing note reviewed.  Constitutional:      Appearance: She is well-developed. She is not diaphoretic.  HENT:     Head: Normocephalic and atraumatic.  Eyes:     General:        Right eye: No discharge.        Left eye: No discharge.     Conjunctiva/sclera: Conjunctivae normal.  Pulmonary:     Effort: Pulmonary effort  is normal. No respiratory distress.  Musculoskeletal:        General: Tenderness present. No swelling, deformity or signs of injury.     Right lower leg: No edema.     Left lower leg: No edema.     Comments: Slight decreased range of motion of the right shoulder secondary to pain, passive range of motion is unremarkable but active range of motion causes some tenderness on the lateral and posterior right shoulder.  There is no tenderness over the bony structures of the [facility] joint, the humerus appears normal there is no swelling or deformity, the elbow and the wrist on the right side are totally normal and there is no swelling or deformity, range of motion of these joints is totally normal.  She has normal pulses of the hand on the right  Skin:    General: Skin is warm and dry.     Findings: No erythema or rash.   Neurological:     Mental Status: She is alert.     Coordination: Coordination normal.     Comments: No numbness or weakness of the right upper extremity     ED Results / Procedures / Treatments   Labs (all labs ordered are listed, but only abnormal results are displayed) Labs Reviewed - No data to display  EKG None  Radiology DG Shoulder Right  Result Date: 08/03/2021 CLINICAL DATA:  Fall EXAM: RIGHT SHOULDER - 2+ VIEW COMPARISON:  None Available. FINDINGS: There is no evidence of fracture or dislocation. There is no evidence of arthropathy or other focal bone abnormality. Soft tissues are unremarkable. Small rotator cuff tendon calcifications IMPRESSION: 1. No fracture or dislocation of the right shoulder. 2. Mild rotator cuff calcific tendinopathy. Electronically Signed   By: Ulyses Jarred M.D.   On: 08/03/2021 21:48    Procedures Procedures    Medications Ordered in ED Medications  ibuprofen (ADVIL) tablet 800 mg (800 mg Oral Given 08/03/21 2140)    ED Course/ Medical Decision Making/ A&P                           Medical Decision Making Amount and/or Complexity of Data Reviewed Radiology: ordered.  Risk Prescription drug management.   There is no evidence of acute trauma to the humerus elbow forearm wrist or hand.  The only area where there is tenderness is over the shoulder girdle.  I suspect this is muscular or potentially partially rotator cuff involved however she has the ability retained to do all of the motions of the rotator cuff.  She can abduct abduct internal and external rotate the 70s to cause some tenderness.  She has no neurologic symptoms and her vascular status is normal with pulses and capillary refill.  Her vital signs reflect some hypertension but at this time not pertinent to what is going on here.  She is in pain and that likely causes some of the hypertension.  Medication management: The patient will be given ibuprofen and an ice pack  topically  Immobilization with a sling by nursing  Imaging: I personally viewed and interpreted the x-rays of the shoulder which showed no signs of fractures or dislocations.  I agree with radiology interpretation.  Calcific tendinitis present        Final Clinical Impression(s) / ED Diagnoses Final diagnoses:  Contusion of right shoulder, initial encounter    Rx / DC Orders ED Discharge Orders  Ordered    ibuprofen (ADVIL) 600 MG tablet  Every 6 hours PRN        08/03/21 2123              Noemi Chapel, MD 08/03/21 2224

## 2021-08-03 NOTE — ED Triage Notes (Signed)
Pt arrived from home via [facility] w c/o right arm and shoulder pain status post fall in which pt tripped and landed on right hand, wrist, shoulder approx 3mn ago. Denies hitting head and not on blood thinners

## 2021-12-11 ENCOUNTER — Other Ambulatory Visit (HOSPITAL_COMMUNITY): Payer: Self-pay | Admitting: Family Medicine

## 2021-12-11 DIAGNOSIS — H53132 Sudden visual loss, left eye: Secondary | ICD-10-CM

## 2022-01-14 ENCOUNTER — Ambulatory Visit (HOSPITAL_COMMUNITY): Payer: BC Managed Care – PPO

## 2022-01-14 ENCOUNTER — Encounter (HOSPITAL_COMMUNITY): Payer: Self-pay

## 2022-05-21 ENCOUNTER — Other Ambulatory Visit (HOSPITAL_COMMUNITY): Payer: Self-pay | Admitting: Internal Medicine

## 2022-05-21 DIAGNOSIS — Z1231 Encounter for screening mammogram for malignant neoplasm of breast: Secondary | ICD-10-CM

## 2022-05-26 ENCOUNTER — Encounter: Payer: Self-pay | Admitting: *Deleted

## 2022-06-04 ENCOUNTER — Ambulatory Visit (HOSPITAL_COMMUNITY)
Admission: RE | Admit: 2022-06-04 | Discharge: 2022-06-04 | Disposition: A | Discharge status: Home / Self Care | Payer: BC Managed Care – PPO | Source: Ambulatory Visit | Attending: Internal Medicine | Admitting: Internal Medicine

## 2022-06-04 DIAGNOSIS — Z1231 Encounter for screening mammogram for malignant neoplasm of breast: Secondary | ICD-10-CM | POA: Diagnosis present

## 2022-11-13 ENCOUNTER — Encounter: Payer: Self-pay | Admitting: *Deleted

## 2023-02-17 DIAGNOSIS — E119 Type 2 diabetes mellitus without complications: Secondary | ICD-10-CM | POA: Diagnosis not present

## 2023-02-17 DIAGNOSIS — H401133 Primary open-angle glaucoma, bilateral, severe stage: Secondary | ICD-10-CM | POA: Diagnosis not present

## 2023-02-17 DIAGNOSIS — Z961 Presence of intraocular lens: Secondary | ICD-10-CM | POA: Diagnosis not present

## 2023-02-17 DIAGNOSIS — H18513 Endothelial corneal dystrophy, bilateral: Secondary | ICD-10-CM | POA: Diagnosis not present

## 2023-03-03 DIAGNOSIS — Z961 Presence of intraocular lens: Secondary | ICD-10-CM | POA: Diagnosis not present

## 2023-03-03 DIAGNOSIS — H18513 Endothelial corneal dystrophy, bilateral: Secondary | ICD-10-CM | POA: Diagnosis not present

## 2023-03-03 DIAGNOSIS — H401133 Primary open-angle glaucoma, bilateral, severe stage: Secondary | ICD-10-CM | POA: Diagnosis not present

## 2023-03-03 DIAGNOSIS — H5202 Hypermetropia, left eye: Secondary | ICD-10-CM | POA: Diagnosis not present

## 2023-03-27 DIAGNOSIS — M79671 Pain in right foot: Secondary | ICD-10-CM | POA: Diagnosis not present

## 2023-03-27 DIAGNOSIS — M1000 Idiopathic gout, unspecified site: Secondary | ICD-10-CM | POA: Diagnosis not present

## 2023-03-27 DIAGNOSIS — R030 Elevated blood-pressure reading, without diagnosis of hypertension: Secondary | ICD-10-CM | POA: Diagnosis not present

## 2023-03-27 DIAGNOSIS — Z6835 Body mass index (BMI) 35.0-35.9, adult: Secondary | ICD-10-CM | POA: Diagnosis not present

## 2023-04-16 DIAGNOSIS — E782 Mixed hyperlipidemia: Secondary | ICD-10-CM | POA: Diagnosis not present

## 2023-04-16 DIAGNOSIS — E559 Vitamin D deficiency, unspecified: Secondary | ICD-10-CM | POA: Diagnosis not present

## 2023-04-16 DIAGNOSIS — E1169 Type 2 diabetes mellitus with other specified complication: Secondary | ICD-10-CM | POA: Diagnosis not present

## 2023-04-22 DIAGNOSIS — E1140 Type 2 diabetes mellitus with diabetic neuropathy, unspecified: Secondary | ICD-10-CM | POA: Diagnosis not present

## 2023-04-22 DIAGNOSIS — R809 Proteinuria, unspecified: Secondary | ICD-10-CM | POA: Diagnosis not present

## 2023-04-22 DIAGNOSIS — I10 Essential (primary) hypertension: Secondary | ICD-10-CM | POA: Diagnosis not present

## 2023-04-22 DIAGNOSIS — E782 Mixed hyperlipidemia: Secondary | ICD-10-CM | POA: Diagnosis not present

## 2023-04-22 DIAGNOSIS — Z0001 Encounter for general adult medical examination with abnormal findings: Secondary | ICD-10-CM | POA: Diagnosis not present

## 2023-04-22 DIAGNOSIS — E1169 Type 2 diabetes mellitus with other specified complication: Secondary | ICD-10-CM | POA: Diagnosis not present

## 2023-05-27 DIAGNOSIS — H18513 Endothelial corneal dystrophy, bilateral: Secondary | ICD-10-CM | POA: Diagnosis not present

## 2023-05-27 DIAGNOSIS — Z961 Presence of intraocular lens: Secondary | ICD-10-CM | POA: Diagnosis not present

## 2023-05-27 DIAGNOSIS — H401133 Primary open-angle glaucoma, bilateral, severe stage: Secondary | ICD-10-CM | POA: Diagnosis not present

## 2023-06-03 DIAGNOSIS — E1169 Type 2 diabetes mellitus with other specified complication: Secondary | ICD-10-CM | POA: Diagnosis not present

## 2023-06-03 DIAGNOSIS — I10 Essential (primary) hypertension: Secondary | ICD-10-CM | POA: Diagnosis not present

## 2023-06-03 DIAGNOSIS — E1140 Type 2 diabetes mellitus with diabetic neuropathy, unspecified: Secondary | ICD-10-CM | POA: Diagnosis not present

## 2023-06-03 DIAGNOSIS — R809 Proteinuria, unspecified: Secondary | ICD-10-CM | POA: Diagnosis not present

## 2023-08-07 DIAGNOSIS — E1169 Type 2 diabetes mellitus with other specified complication: Secondary | ICD-10-CM | POA: Diagnosis not present

## 2023-08-07 DIAGNOSIS — E559 Vitamin D deficiency, unspecified: Secondary | ICD-10-CM | POA: Diagnosis not present

## 2023-08-07 DIAGNOSIS — E782 Mixed hyperlipidemia: Secondary | ICD-10-CM | POA: Diagnosis not present

## 2023-08-07 IMAGING — CT CT HEAD W/O CM
3 series · 16 of 47 positions shown, 19 images · non-contrast
Comparison: None.

CLINICAL DATA: Paresthesias



[Series 2: head w o · axial · 0.46mm/px · z∈[+1265,+1390]mm · 10 of 31 slices shown, 13 images]
[im 3/31  brain]
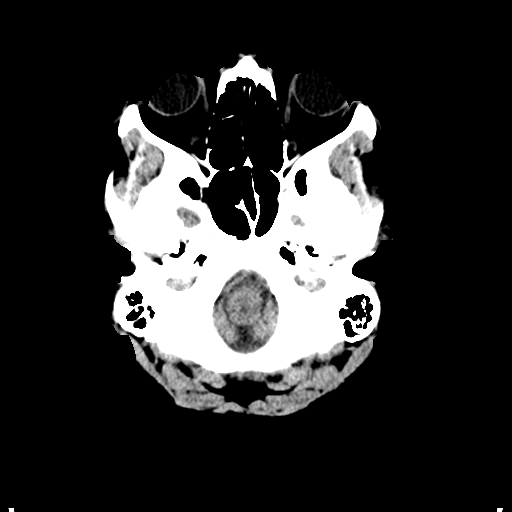
[im 3/31  bone]
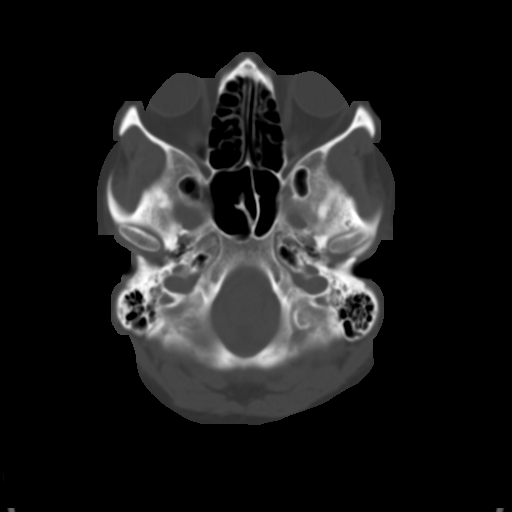
[im 6/31  brain]
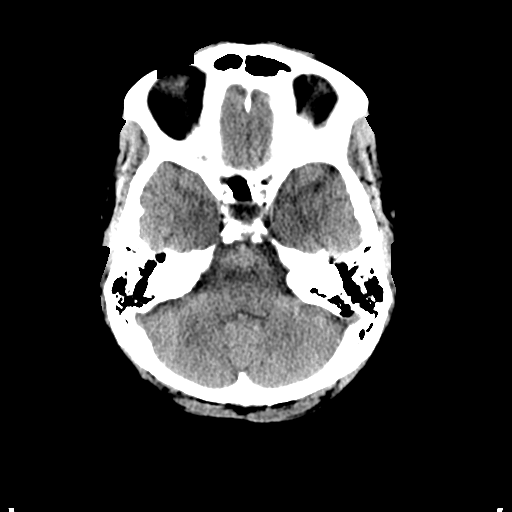
[im 9/31  brain]
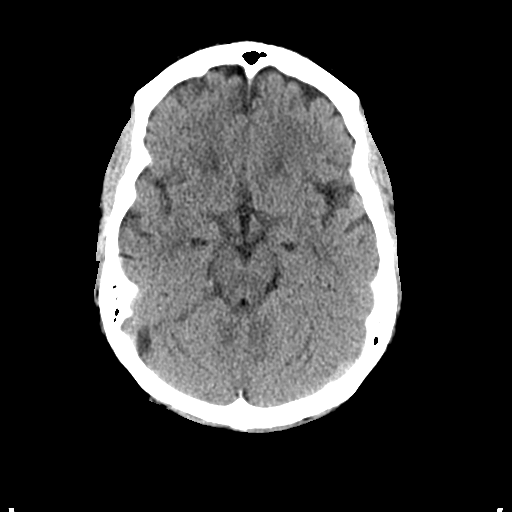
[im 11/31  brain]
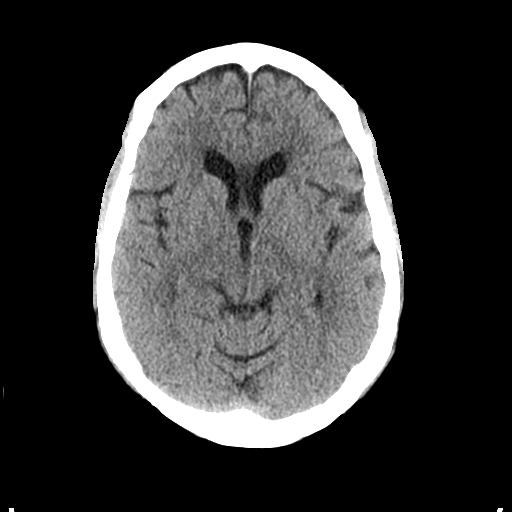
[im 14/31  brain]
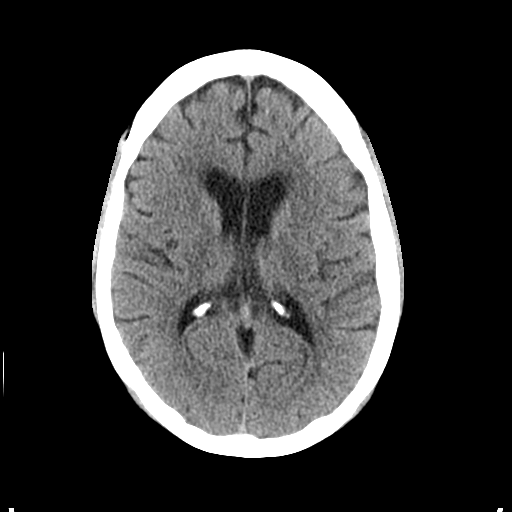
[im 14/31  bone]
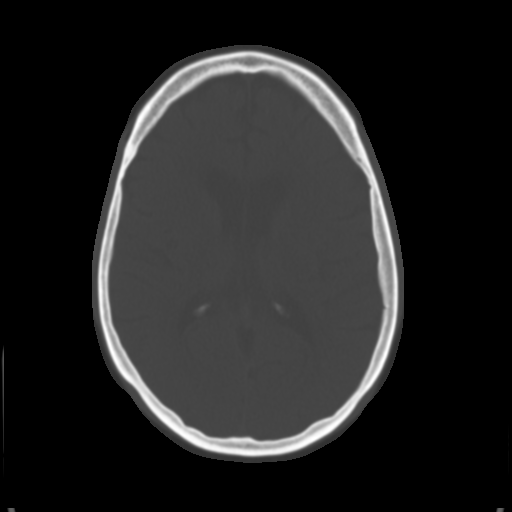
[im 17/31  brain]
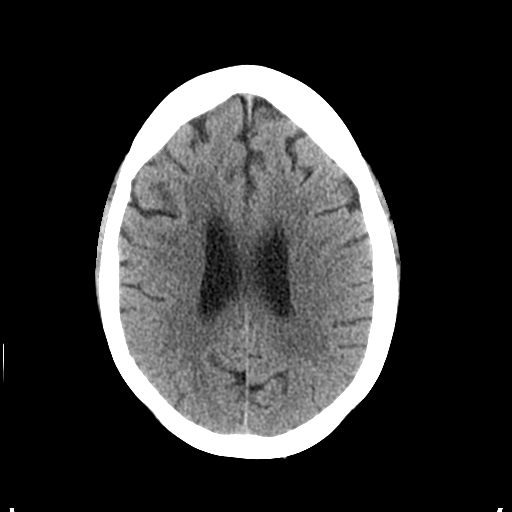
[im 20/31  brain]
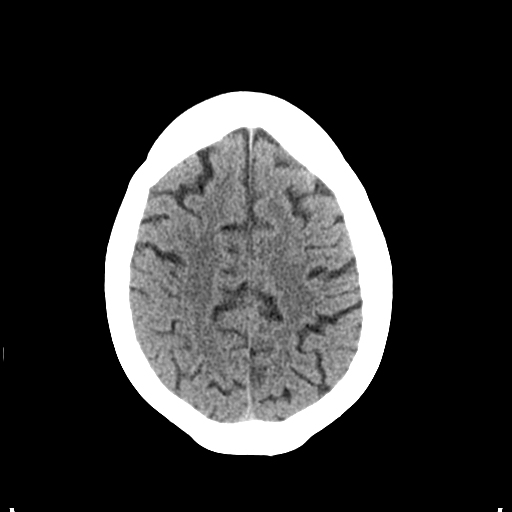
[im 23/31  brain]
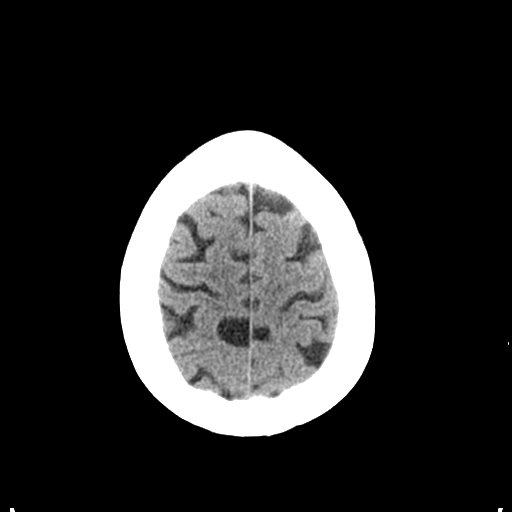
[im 25/31  brain]
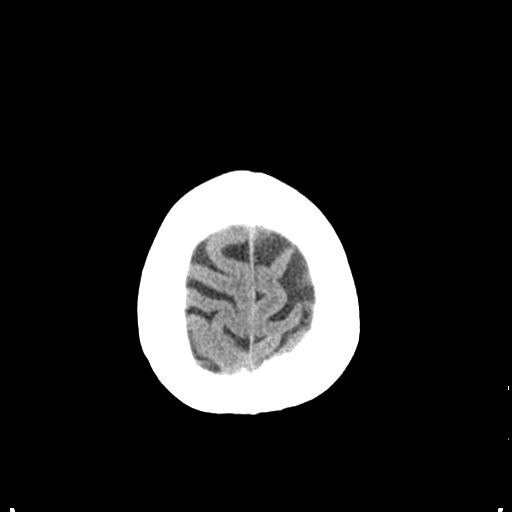
[im 25/31  bone]
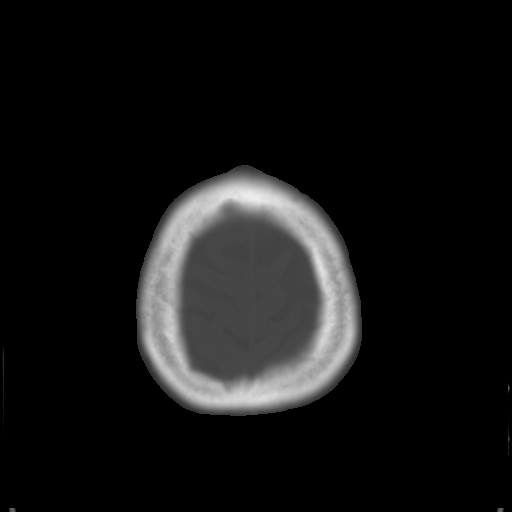
[im 28/31  brain]
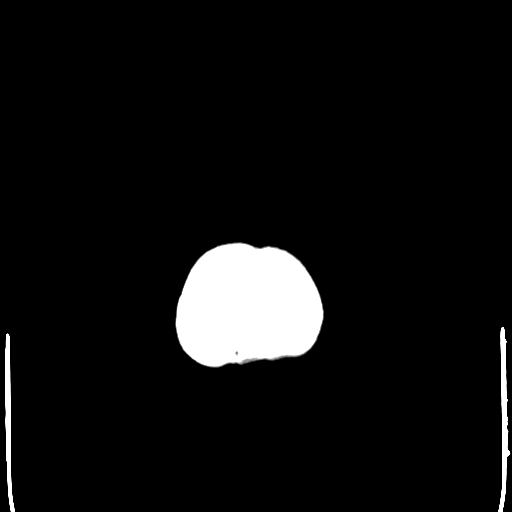

[Series 4: coronal soft · coronal · 0.33mm/px · 3 of 70 slices shown]
[im 24/70  brain]
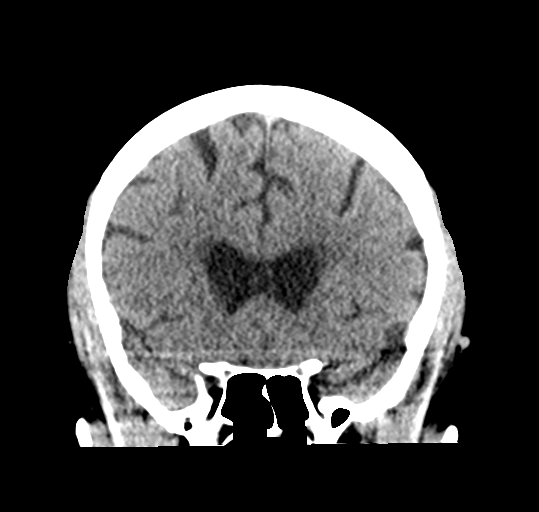
[im 31/70  brain]
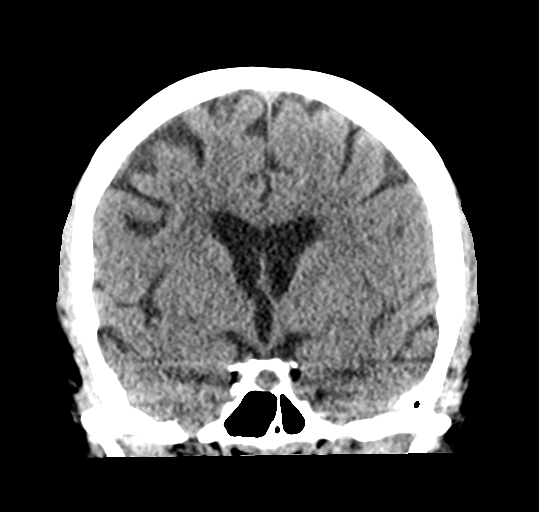
[im 39/70  brain]
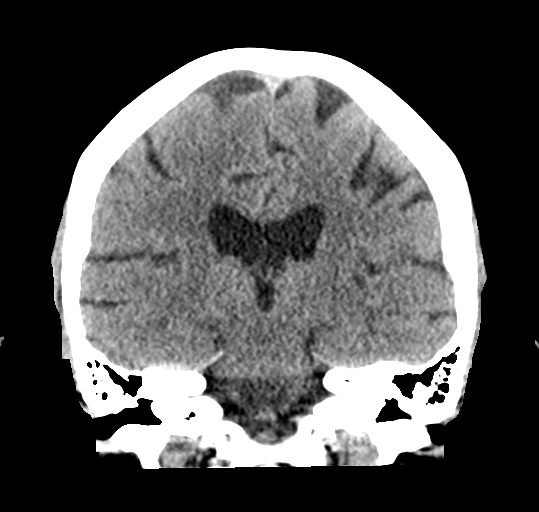

[Series 5: sagittal soft · sagittal · 0.33mm/px · 3 of 60 slices shown]
[im 20/60  brain]
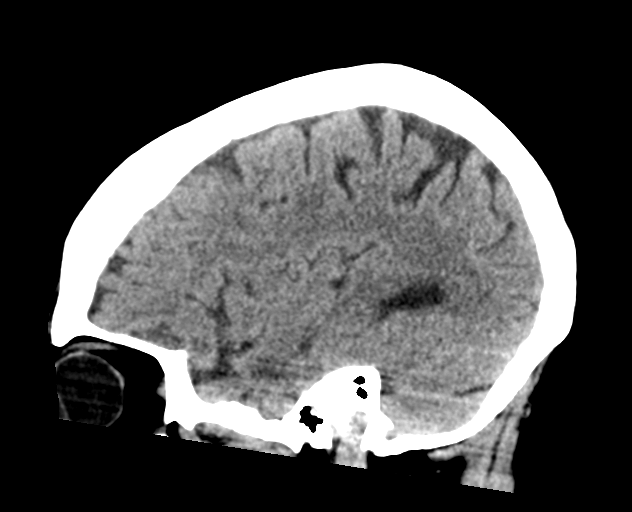
[im 30/60  brain]
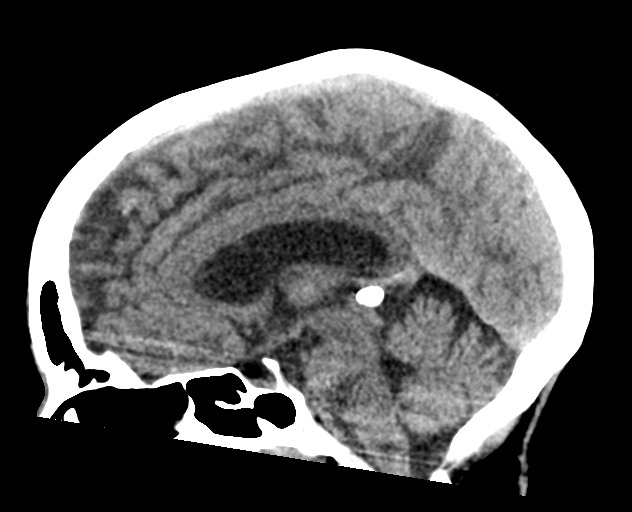
[im 40/60  brain]
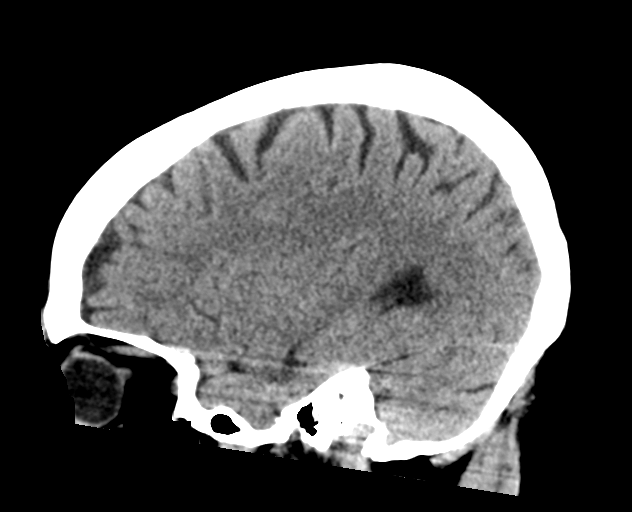

[16 of 47 positions shown; findings below may reference images not displayed]

FINDINGS: Brain: No acute intracranial hemorrhage, mass effect, or herniation.
No extra-axial fluid collections. No evidence of acute territorial
infarct. No hydrocephalus. Mild cortical volume loss. Mild patchy
hypodensities in the periventricular and subcortical white matter,
likely secondary to chronic microvascular ischemic changes.

Vascular: No hyperdense vessel or unexpected calcification.

Skull: Normal. Negative for fracture or focal lesion.

Sinuses/Orbits: No acute finding.

Other: None.
IMPRESSION: Chronic changes with no acute intracranial process identified.

## 2023-08-14 DIAGNOSIS — E1169 Type 2 diabetes mellitus with other specified complication: Secondary | ICD-10-CM | POA: Diagnosis not present

## 2023-08-14 DIAGNOSIS — E782 Mixed hyperlipidemia: Secondary | ICD-10-CM | POA: Diagnosis not present

## 2023-08-14 DIAGNOSIS — E1140 Type 2 diabetes mellitus with diabetic neuropathy, unspecified: Secondary | ICD-10-CM | POA: Diagnosis not present

## 2023-08-14 DIAGNOSIS — I10 Essential (primary) hypertension: Secondary | ICD-10-CM | POA: Diagnosis not present

## 2023-08-14 DIAGNOSIS — R809 Proteinuria, unspecified: Secondary | ICD-10-CM | POA: Diagnosis not present

## 2023-08-18 ENCOUNTER — Encounter (INDEPENDENT_AMBULATORY_CARE_PROVIDER_SITE_OTHER): Payer: Self-pay | Admitting: *Deleted

## 2023-11-23 DIAGNOSIS — E782 Mixed hyperlipidemia: Secondary | ICD-10-CM | POA: Diagnosis not present

## 2023-11-23 DIAGNOSIS — E559 Vitamin D deficiency, unspecified: Secondary | ICD-10-CM | POA: Diagnosis not present

## 2023-11-23 DIAGNOSIS — E1169 Type 2 diabetes mellitus with other specified complication: Secondary | ICD-10-CM | POA: Diagnosis not present

## 2023-11-27 DIAGNOSIS — Z23 Encounter for immunization: Secondary | ICD-10-CM | POA: Diagnosis not present

## 2023-11-27 DIAGNOSIS — E1140 Type 2 diabetes mellitus with diabetic neuropathy, unspecified: Secondary | ICD-10-CM | POA: Diagnosis not present

## 2023-11-27 DIAGNOSIS — I10 Essential (primary) hypertension: Secondary | ICD-10-CM | POA: Diagnosis not present

## 2023-11-27 DIAGNOSIS — R809 Proteinuria, unspecified: Secondary | ICD-10-CM | POA: Diagnosis not present

## 2023-11-27 DIAGNOSIS — M79602 Pain in left arm: Secondary | ICD-10-CM | POA: Diagnosis not present

## 2023-11-27 DIAGNOSIS — E1169 Type 2 diabetes mellitus with other specified complication: Secondary | ICD-10-CM | POA: Diagnosis not present

## 2023-12-01 DIAGNOSIS — H18513 Endothelial corneal dystrophy, bilateral: Secondary | ICD-10-CM | POA: Diagnosis not present

## 2023-12-01 DIAGNOSIS — H5203 Hypermetropia, bilateral: Secondary | ICD-10-CM | POA: Diagnosis not present

## 2023-12-01 DIAGNOSIS — H524 Presbyopia: Secondary | ICD-10-CM | POA: Diagnosis not present

## 2023-12-01 DIAGNOSIS — H401133 Primary open-angle glaucoma, bilateral, severe stage: Secondary | ICD-10-CM | POA: Diagnosis not present

## 2023-12-01 DIAGNOSIS — Z961 Presence of intraocular lens: Secondary | ICD-10-CM | POA: Diagnosis not present
